# Patient Record
Sex: Female | Born: 1940 | Race: Black or African American | Hispanic: No | Marital: Single | Smoking: Never smoker
Health system: Southern US, Community
[De-identification: ages and names within clinical notes are randomized; demographics above are authoritative.]

## PROBLEM LIST (undated history)

## (undated) DIAGNOSIS — I10 Essential (primary) hypertension: Secondary | ICD-10-CM

## (undated) DIAGNOSIS — H409 Unspecified glaucoma: Secondary | ICD-10-CM

---

## 1998-04-05 ENCOUNTER — Ambulatory Visit (HOSPITAL_COMMUNITY): Admission: RE | Admit: 1998-04-05 | Discharge: 1998-04-05 | Payer: Self-pay

## 1999-04-08 ENCOUNTER — Ambulatory Visit (HOSPITAL_COMMUNITY): Admission: RE | Admit: 1999-04-08 | Discharge: 1999-04-08 | Payer: Self-pay | Admitting: Internal Medicine

## 1999-04-08 ENCOUNTER — Encounter: Payer: Self-pay | Admitting: Internal Medicine

## 2000-04-09 ENCOUNTER — Encounter: Payer: Self-pay | Admitting: Internal Medicine

## 2000-04-09 ENCOUNTER — Ambulatory Visit (HOSPITAL_COMMUNITY): Admission: RE | Admit: 2000-04-09 | Discharge: 2000-04-09 | Payer: Self-pay | Admitting: Internal Medicine

## 2000-04-17 ENCOUNTER — Encounter: Payer: Self-pay | Admitting: Internal Medicine

## 2000-04-17 ENCOUNTER — Encounter: Admission: RE | Admit: 2000-04-17 | Discharge: 2000-04-17 | Payer: Self-pay | Admitting: Internal Medicine

## 2001-02-20 HISTORY — PX: BREAST EXCISIONAL BIOPSY: SUR124

## 2001-02-20 HISTORY — PX: BREAST BIOPSY: SHX20

## 2001-04-10 ENCOUNTER — Ambulatory Visit (HOSPITAL_COMMUNITY): Admission: RE | Admit: 2001-04-10 | Discharge: 2001-04-10 | Payer: Self-pay | Admitting: Internal Medicine

## 2001-04-10 ENCOUNTER — Encounter: Payer: Self-pay | Admitting: Internal Medicine

## 2001-04-25 ENCOUNTER — Encounter: Admission: RE | Admit: 2001-04-25 | Discharge: 2001-04-25 | Payer: Self-pay | Admitting: Internal Medicine

## 2001-04-25 ENCOUNTER — Encounter: Payer: Self-pay | Admitting: Internal Medicine

## 2001-05-06 ENCOUNTER — Encounter: Payer: Self-pay | Admitting: Internal Medicine

## 2001-05-06 ENCOUNTER — Encounter: Admission: RE | Admit: 2001-05-06 | Discharge: 2001-05-06 | Payer: Self-pay | Admitting: Internal Medicine

## 2002-05-13 ENCOUNTER — Encounter: Payer: Self-pay | Admitting: Internal Medicine

## 2002-05-13 ENCOUNTER — Encounter: Admission: RE | Admit: 2002-05-13 | Discharge: 2002-05-13 | Payer: Self-pay | Admitting: Internal Medicine

## 2003-05-14 ENCOUNTER — Encounter: Admission: RE | Admit: 2003-05-14 | Discharge: 2003-05-14 | Payer: Self-pay | Admitting: Internal Medicine

## 2004-05-17 ENCOUNTER — Encounter: Admission: RE | Admit: 2004-05-17 | Discharge: 2004-05-17 | Payer: Self-pay | Admitting: *Deleted

## 2005-05-29 ENCOUNTER — Encounter: Admission: RE | Admit: 2005-05-29 | Discharge: 2005-05-29 | Payer: Self-pay | Admitting: *Deleted

## 2005-12-18 ENCOUNTER — Other Ambulatory Visit: Admission: RE | Admit: 2005-12-18 | Discharge: 2005-12-18 | Payer: Self-pay | Admitting: Obstetrics and Gynecology

## 2006-05-01 ENCOUNTER — Emergency Department (HOSPITAL_COMMUNITY): Admission: EM | Admit: 2006-05-01 | Discharge: 2006-05-01 | Payer: Self-pay | Admitting: Emergency Medicine

## 2006-05-02 ENCOUNTER — Ambulatory Visit (HOSPITAL_COMMUNITY): Admission: RE | Admit: 2006-05-02 | Discharge: 2006-05-02 | Payer: Self-pay | Admitting: Family Medicine

## 2006-06-06 ENCOUNTER — Encounter: Admission: RE | Admit: 2006-06-06 | Discharge: 2006-06-06 | Payer: Self-pay | Admitting: Obstetrics and Gynecology

## 2006-06-22 ENCOUNTER — Encounter: Admission: RE | Admit: 2006-06-22 | Discharge: 2006-06-22 | Payer: Self-pay | Admitting: Obstetrics and Gynecology

## 2007-06-10 ENCOUNTER — Encounter: Admission: RE | Admit: 2007-06-10 | Discharge: 2007-06-10 | Payer: Self-pay | Admitting: Obstetrics and Gynecology

## 2008-03-14 ENCOUNTER — Emergency Department (HOSPITAL_COMMUNITY): Admission: EM | Admit: 2008-03-14 | Discharge: 2008-03-14 | Payer: Self-pay | Admitting: Emergency Medicine

## 2008-06-15 ENCOUNTER — Encounter: Admission: RE | Admit: 2008-06-15 | Discharge: 2008-06-15 | Payer: Self-pay | Admitting: Obstetrics and Gynecology

## 2009-05-27 ENCOUNTER — Ambulatory Visit: Payer: Self-pay | Admitting: Vascular Surgery

## 2009-05-27 ENCOUNTER — Emergency Department (HOSPITAL_COMMUNITY): Admission: EM | Admit: 2009-05-27 | Discharge: 2009-05-27 | Payer: Self-pay | Admitting: Family Medicine

## 2009-06-04 ENCOUNTER — Ambulatory Visit (HOSPITAL_COMMUNITY): Admission: RE | Admit: 2009-06-04 | Discharge: 2009-06-04 | Payer: Self-pay | Admitting: Obstetrics and Gynecology

## 2009-06-16 ENCOUNTER — Encounter: Admission: RE | Admit: 2009-06-16 | Discharge: 2009-06-16 | Payer: Self-pay | Admitting: Obstetrics and Gynecology

## 2010-03-13 ENCOUNTER — Encounter: Payer: Self-pay | Admitting: Obstetrics and Gynecology

## 2010-05-09 ENCOUNTER — Other Ambulatory Visit: Payer: Self-pay | Admitting: Obstetrics and Gynecology

## 2010-05-09 DIAGNOSIS — Z1231 Encounter for screening mammogram for malignant neoplasm of breast: Secondary | ICD-10-CM

## 2010-05-11 LAB — CBC
HCT: 40.7 % (ref 36.0–46.0)
Hemoglobin: 13.2 g/dL (ref 12.0–15.0)
MCV: 83.9 fL (ref 78.0–100.0)

## 2010-05-11 LAB — BASIC METABOLIC PANEL
CO2: 26 mEq/L (ref 19–32)
Chloride: 102 mEq/L (ref 96–112)
GFR calc Af Amer: >60 mL/min (ref >60)
Sodium: 138 mEq/L (ref 135–145)

## 2010-06-20 ENCOUNTER — Ambulatory Visit
Admission: RE | Admit: 2010-06-20 | Discharge: 2010-06-20 | Disposition: A | Discharge status: Home / Self Care | Payer: Medicare Other | Source: Ambulatory Visit | Attending: Obstetrics and Gynecology | Admitting: Obstetrics and Gynecology

## 2010-06-20 DIAGNOSIS — Z1231 Encounter for screening mammogram for malignant neoplasm of breast: Secondary | ICD-10-CM

## 2010-09-23 DIAGNOSIS — I10 Essential (primary) hypertension: Secondary | ICD-10-CM | POA: Insufficient documentation

## 2010-09-23 DIAGNOSIS — J309 Allergic rhinitis, unspecified: Secondary | ICD-10-CM | POA: Insufficient documentation

## 2010-09-23 DIAGNOSIS — M7730 Calcaneal spur, unspecified foot: Secondary | ICD-10-CM | POA: Insufficient documentation

## 2010-09-23 DIAGNOSIS — H409 Unspecified glaucoma: Secondary | ICD-10-CM | POA: Insufficient documentation

## 2010-09-23 DIAGNOSIS — IMO0002 Reserved for concepts with insufficient information to code with codable children: Secondary | ICD-10-CM | POA: Insufficient documentation

## 2010-09-23 DIAGNOSIS — Z803 Family history of malignant neoplasm of breast: Secondary | ICD-10-CM | POA: Insufficient documentation

## 2010-09-23 DIAGNOSIS — N951 Menopausal and female climacteric states: Secondary | ICD-10-CM | POA: Insufficient documentation

## 2011-04-21 DIAGNOSIS — D229 Melanocytic nevi, unspecified: Secondary | ICD-10-CM | POA: Insufficient documentation

## 2011-05-15 ENCOUNTER — Other Ambulatory Visit: Payer: Self-pay | Admitting: Obstetrics and Gynecology

## 2011-05-15 DIAGNOSIS — Z1231 Encounter for screening mammogram for malignant neoplasm of breast: Secondary | ICD-10-CM

## 2011-06-22 ENCOUNTER — Ambulatory Visit
Admission: RE | Admit: 2011-06-22 | Discharge: 2011-06-22 | Disposition: A | Discharge status: Home / Self Care | Payer: BC Managed Care – PPO | Source: Ambulatory Visit | Attending: Obstetrics and Gynecology | Admitting: Obstetrics and Gynecology

## 2011-06-22 DIAGNOSIS — Z1231 Encounter for screening mammogram for malignant neoplasm of breast: Secondary | ICD-10-CM

## 2011-11-15 DIAGNOSIS — H40019 Open angle with borderline findings, low risk, unspecified eye: Secondary | ICD-10-CM | POA: Insufficient documentation

## 2012-05-21 ENCOUNTER — Other Ambulatory Visit: Payer: Self-pay

## 2012-05-21 DIAGNOSIS — Z1231 Encounter for screening mammogram for malignant neoplasm of breast: Secondary | ICD-10-CM

## 2012-06-24 ENCOUNTER — Ambulatory Visit
Admission: RE | Admit: 2012-06-24 | Discharge: 2012-06-24 | Disposition: A | Discharge status: Home / Self Care | Payer: Medicare Other | Source: Ambulatory Visit

## 2012-06-24 DIAGNOSIS — Z1231 Encounter for screening mammogram for malignant neoplasm of breast: Secondary | ICD-10-CM

## 2013-05-29 ENCOUNTER — Other Ambulatory Visit: Payer: Self-pay

## 2013-05-29 DIAGNOSIS — Z1231 Encounter for screening mammogram for malignant neoplasm of breast: Secondary | ICD-10-CM

## 2013-06-25 ENCOUNTER — Encounter (INDEPENDENT_AMBULATORY_CARE_PROVIDER_SITE_OTHER): Payer: Self-pay

## 2013-06-25 ENCOUNTER — Ambulatory Visit
Admission: RE | Admit: 2013-06-25 | Discharge: 2013-06-25 | Disposition: A | Discharge status: Home / Self Care | Payer: Medicare Other | Source: Ambulatory Visit

## 2013-06-25 DIAGNOSIS — Z1231 Encounter for screening mammogram for malignant neoplasm of breast: Secondary | ICD-10-CM

## 2014-05-25 ENCOUNTER — Other Ambulatory Visit: Payer: Self-pay

## 2014-05-25 DIAGNOSIS — Z1231 Encounter for screening mammogram for malignant neoplasm of breast: Secondary | ICD-10-CM

## 2014-06-29 ENCOUNTER — Ambulatory Visit
Admission: RE | Admit: 2014-06-29 | Discharge: 2014-06-29 | Disposition: A | Discharge status: Home / Self Care | Payer: Medicare Other | Source: Ambulatory Visit

## 2014-06-29 DIAGNOSIS — Z1231 Encounter for screening mammogram for malignant neoplasm of breast: Secondary | ICD-10-CM

## 2014-07-01 ENCOUNTER — Other Ambulatory Visit: Payer: Self-pay | Admitting: Internal Medicine

## 2014-07-01 DIAGNOSIS — R928 Other abnormal and inconclusive findings on diagnostic imaging of breast: Secondary | ICD-10-CM

## 2014-07-06 ENCOUNTER — Ambulatory Visit
Admission: RE | Admit: 2014-07-06 | Discharge: 2014-07-06 | Disposition: A | Discharge status: Home / Self Care | Payer: Medicare Other | Source: Ambulatory Visit | Attending: Internal Medicine | Admitting: Internal Medicine

## 2014-07-06 DIAGNOSIS — R928 Other abnormal and inconclusive findings on diagnostic imaging of breast: Secondary | ICD-10-CM

## 2015-05-31 ENCOUNTER — Other Ambulatory Visit: Payer: Self-pay

## 2015-05-31 DIAGNOSIS — Z1231 Encounter for screening mammogram for malignant neoplasm of breast: Secondary | ICD-10-CM

## 2015-06-30 ENCOUNTER — Ambulatory Visit
Admission: RE | Admit: 2015-06-30 | Discharge: 2015-06-30 | Disposition: A | Discharge status: Home / Self Care | Payer: Medicare Other | Source: Ambulatory Visit

## 2015-06-30 DIAGNOSIS — Z1231 Encounter for screening mammogram for malignant neoplasm of breast: Secondary | ICD-10-CM

## 2016-02-10 ENCOUNTER — Other Ambulatory Visit: Payer: Self-pay | Admitting: Internal Medicine

## 2016-02-10 ENCOUNTER — Ambulatory Visit
Admission: RE | Admit: 2016-02-10 | Discharge: 2016-02-10 | Disposition: A | Discharge status: Home / Self Care | Payer: Medicare Other | Source: Ambulatory Visit | Attending: Internal Medicine | Admitting: Internal Medicine

## 2016-02-10 DIAGNOSIS — M542 Cervicalgia: Secondary | ICD-10-CM

## 2016-05-19 ENCOUNTER — Other Ambulatory Visit: Payer: Self-pay | Admitting: Internal Medicine

## 2016-05-19 DIAGNOSIS — Z1231 Encounter for screening mammogram for malignant neoplasm of breast: Secondary | ICD-10-CM

## 2016-06-30 ENCOUNTER — Ambulatory Visit
Admission: RE | Admit: 2016-06-30 | Discharge: 2016-06-30 | Disposition: A | Discharge status: Home / Self Care | Payer: Medicare Other | Source: Ambulatory Visit | Attending: Internal Medicine | Admitting: Internal Medicine

## 2016-06-30 DIAGNOSIS — Z1231 Encounter for screening mammogram for malignant neoplasm of breast: Secondary | ICD-10-CM

## 2016-12-16 ENCOUNTER — Encounter (HOSPITAL_COMMUNITY): Payer: Self-pay | Admitting: Emergency Medicine

## 2016-12-16 ENCOUNTER — Emergency Department (HOSPITAL_COMMUNITY): Payer: No Typology Code available for payment source

## 2016-12-16 ENCOUNTER — Emergency Department (HOSPITAL_COMMUNITY)
Admission: EM | Admit: 2016-12-16 | Discharge: 2016-12-16 | Disposition: A | Discharge status: Home / Self Care | Payer: No Typology Code available for payment source | Attending: Emergency Medicine | Admitting: Emergency Medicine

## 2016-12-16 DIAGNOSIS — M791 Myalgia, unspecified site: Secondary | ICD-10-CM

## 2016-12-16 DIAGNOSIS — M79642 Pain in left hand: Secondary | ICD-10-CM

## 2016-12-16 DIAGNOSIS — Y9389 Activity, other specified: Secondary | ICD-10-CM | POA: Diagnosis not present

## 2016-12-16 DIAGNOSIS — Y92410 Unspecified street and highway as the place of occurrence of the external cause: Secondary | ICD-10-CM | POA: Insufficient documentation

## 2016-12-16 DIAGNOSIS — M25561 Pain in right knee: Secondary | ICD-10-CM | POA: Insufficient documentation

## 2016-12-16 DIAGNOSIS — I10 Essential (primary) hypertension: Secondary | ICD-10-CM | POA: Diagnosis not present

## 2016-12-16 DIAGNOSIS — M25511 Pain in right shoulder: Secondary | ICD-10-CM | POA: Diagnosis not present

## 2016-12-16 DIAGNOSIS — Y999 Unspecified external cause status: Secondary | ICD-10-CM | POA: Insufficient documentation

## 2016-12-16 HISTORY — DX: Essential (primary) hypertension: I10

## 2016-12-16 HISTORY — DX: Unspecified glaucoma: H40.9

## 2016-12-16 MED ORDER — ACETAMINOPHEN 500 MG PO TABS
500.0000 mg | ORAL_TABLET | Freq: Once | ORAL | Status: AC
  Administered 2016-12-16: 500 mg via ORAL
  Filled 2016-12-16: qty 1
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
## 2017-05-21 ENCOUNTER — Other Ambulatory Visit: Payer: Self-pay | Admitting: Internal Medicine

## 2017-05-21 DIAGNOSIS — Z1231 Encounter for screening mammogram for malignant neoplasm of breast: Secondary | ICD-10-CM

## 2017-07-02 ENCOUNTER — Ambulatory Visit
Admission: RE | Admit: 2017-07-02 | Discharge: 2017-07-02 | Disposition: A | Discharge status: Home / Self Care | Payer: Medicare Other | Source: Ambulatory Visit | Attending: Internal Medicine | Admitting: Internal Medicine

## 2017-07-02 DIAGNOSIS — Z1231 Encounter for screening mammogram for malignant neoplasm of breast: Secondary | ICD-10-CM

## 2018-03-01 ENCOUNTER — Encounter: Payer: Self-pay | Admitting: Podiatry

## 2018-03-01 ENCOUNTER — Ambulatory Visit: Payer: Medicare Other | Admitting: Podiatry

## 2018-03-01 VITALS — BP 153/89

## 2018-03-01 DIAGNOSIS — M79675 Pain in left toe(s): Secondary | ICD-10-CM

## 2018-03-01 DIAGNOSIS — B351 Tinea unguium: Secondary | ICD-10-CM | POA: Diagnosis not present

## 2018-03-01 DIAGNOSIS — M79674 Pain in right toe(s): Secondary | ICD-10-CM | POA: Diagnosis not present

## 2018-03-01 NOTE — Progress Notes (Signed)
Subjective: Glenda Roth presents today referred by Seward Carol, MD with cc of painful, discolored, thick toenails which interfere with daily activities.  Pain is aggravated when wearing enclosed shoe gear. Pain is relieved with periodic professional debridement.  Past Medical History:  Diagnosis Date  . Glaucoma   . Hypertension      Patient Active Problem List   Diagnosis Date Noted  . 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 Atypical nevi 04/21/2011  . Allergic rhinitis 09/23/2010  . DDD (degenerative disc disease) 09/23/2010  . Essential hypertension 09/23/2010  . FH: breast cancer 09/23/2010  . Heel spur 09/23/2010  . Menopausal syndrome 09/23/2010  . Glaucoma 09/23/2010     Past Surgical History:  Procedure Laterality Date  . BREAST BIOPSY Right 2003  . BREAST EXCISIONAL BIOPSY Right 2003   benign      Current Outpatient Medications:  .  cephALEXin (KEFLEX) 500 MG capsule, Reported on 07/06/2015, Disp: , Rfl:  .  cycloSPORINE (RESTASIS) 0.05 % ophthalmic emulsion, Apply to eye., Disp: , Rfl:  .  hydrochlorothiazide (HYDRODIURIL) 25 MG tablet, Take by mouth., Disp: , Rfl:  .  losartan (COZAAR) 50 MG tablet, Take 25 mg by mouth., Disp: , Rfl:  .  meloxicam (MOBIC) 15 MG tablet, Take by mouth., Disp: , Rfl:  .  rosuvastatin (CRESTOR) 5 MG tablet, Take 5 mg by mouth daily., Disp: , Rfl:    Allergies  Allergen Reactions  . Ace Inhibitors Cough  . Other Palpitations    Uncoded Allergy. Allergen: ANTIHISTAMINES AND DECONGESTANTS     Social History   Occupational History  . Not on file  Tobacco Use  . Smoking status: Never Smoker  . Smokeless tobacco: Never Used  Substance and Sexual Activity  . Alcohol use: Not on file  . Drug use: Not on file  . Sexual activity: Not on file     Family History  Problem Relation Age of Onset  . Breast cancer Sister       There is no immunization history on file for this patient.   Review of  systems: Positive Findings in bold print.  Constitutional:  chills, fatigue, fever, sweats, weight change Communication: Optometrist, sign Ecologist, hand writing, iPad/Android device Head: headaches, head injury Eyes: changes in vision, eye pain, glaucoma, cataracts, macular degeneration, diplopia, glare,  light sensitivity, eyeglasses or contacts, blindness Ears nose mouth throat: Hard of hearing, ringing in ears, deaf, sign language,  vertigo,   nosebleeds,  rhinitis,  cold sores, snoring, swollen glands Cardiovascular: HTN, edema, arrhythmia, pacemaker in place, defibrillator in place,  chest pain/tightness, chronic anticoagulation, blood clot, heart failure Peripheral Vascular: leg cramps, varicose veins, blood clots, lymphedema Respiratory:  difficulty breathing, denies congestion, SOB, wheezing, cough, emphysema Gastrointestinal: change in appetite or weight, abdominal pain, constipation, diarrhea, nausea, vomiting, vomiting blood, change in bowel habits, abdominal pain, jaundice, rectal bleeding, hemorrhoids, Genitourinary:  nocturia,  pain on urination,  blood in urine, Foley catheter, urinary urgency Musculoskeletal: uses mobility aid,  cramping, stiff joints, painful joints, decreased joint motion, fractures, OA, gout Skin: +changes in toenails, color change, dryness, itching, mole changes,  rash  Neurological: headaches, numbness in feet, paresthesias in feet, burning in feet, fainting,  seizures, change in speech. denies headaches, memory problems/poor historian, cerebral palsy, weakness, paralysis Endocrine: diabetes, hypothyroidism, hyperthyroidism,  goiter, dry mouth, flushing, heat intolerance,  cold intolerance,  excessive thirst, denies polyuria,  nocturia Hematological:  easy bleeding, excessive bleeding, easy bruising, enlarged lymph nodes,  on long term blood thinner, history of past transusions Allergy/immunological:  hives, eczema, frequent infections, multiple drug  allergies, seasonal allergies, transplant recipient Psychiatric:  anxiety, depression, mood disorder, suicidal ideations, hallucinations   Objective: Vascular Examination: Capillary refill time immediate x 10 digits Dorsalis pedis and posterior tibial pulses present b/l No digital hair x 10 digits Skin temperature gradient WNL b/l  Dermatological Examination: Skin with normal turgor, texture and tone b/l  Toenails 1-5 b/l discolored, thick, dystrophic with subungual debris and pain with palpation to nailbeds due to thickness of nails.  Musculoskeletal: Muscle strength 5/5 to all LE muscle groups  HAV with bunion b/l  Hammertoes 2-5 b/l  Neurological: Sensation intact with 10 gram monofilament Vibratory sensation intact.  Assessment: 1. Painful onychomycosis toenails 1-5 b/l    Plan: 1. Discussed onychomycosis and treatment options.  Literature dispensed on today. 2. Will purchase Toecyclen Gel for daily application  3. Toenails 1-5 b/l were debrided in length and girth without iatrogenic bleeding. 4. Patient to continue soft, supportive shoe gear 5. Patient to report any pedal injuries to medical professional immediately. 6. Follow up 3 months. Patient/POA to call should there be a concern in the interim.

## 2018-03-01 NOTE — Patient Instructions (Signed)
Onychomycosis/Fungal Toenails  WHAT IS IT? An infection that lies within the keratin of your nail plate that is caused by a fungus.  WHY ME? Fungal infections affect all ages, sexes, races, and creeds.  There may be many factors that predispose you to a fungal infection such as age, coexisting medical conditions such as diabetes, or an autoimmune disease; stress, medications, fatigue, genetics, etc.  Bottom line: fungus thrives in a warm, moist environment and your shoes offer such a location.  IS IT CONTAGIOUS? Theoretically, yes.  You do not want to share shoes, nail clippers or files with someone who has fungal toenails.  Walking around barefoot in the same room or sleeping in the same bed is unlikely to transfer the organism.  It is important to realize, however, that fungus can spread easily from one nail to the next on the same foot.  HOW DO WE TREAT THIS?  There are several ways to treat this condition.  Treatment may depend on many factors such as age, medications, pregnancy, liver and kidney conditions, etc.  It is best to ask your doctor which options are available to you.  1. No treatment.   Unlike many other medical concerns, you can live with this condition.  However for many people this can be a painful condition and may lead to ingrown toenails or a bacterial infection.  It is recommended that you keep the nails cut short to help reduce the amount of fungal nail. 2. Topical treatment.  These range from herbal remedies to prescription strength nail lacquers.  About 40-50% effective, topicals require twice daily application for approximately 9 to 12 months or until an entirely new nail has grown out.  The most effective topicals are medical grade medications available through physicians offices. 3. Oral antifungal medications.  With an 80-90% cure rate, the most common oral medication requires 3 to 4 months of therapy and stays in your system for a year as the new nail grows out.  Oral  antifungal medications do require blood work to make sure it is a safe drug for you.  A liver function panel will be performed prior to starting the medication and after the first month of treatment.  It is important to have the blood work performed to avoid any harmful side effects.  In general, this medication safe but blood work is required. 4. Laser Therapy.  This treatment is performed by applying a specialized laser to the affected nail plate.  This therapy is noninvasive, fast, and non-painful.  It is not covered by insurance and is therefore, out of pocket.  The results have been very good with a 80-95% cure rate.  [facility] is the only practice in the area to offer this therapy. 5. Permanent Nail Avulsion.  Removing the entire nail so that a new nail will not grow back.  Corns and Calluses Corns are small areas of thickened skin that occur on the top, sides, or tip of a toe. They contain a cone-shaped core with a point that can press on a nerve below. This causes pain.  Calluses are areas of thickened skin that can occur anywhere on the body, including the hands, fingers, palms, soles of the feet, and heels. Calluses are usually larger than corns. What are the causes? Corns and calluses are caused by rubbing (friction) or pressure, such as from shoes that are too tight or do not fit properly. What increases the risk? Corns are more likely to develop in people   who have misshapen toes (toe deformities), such as hammer toes. Calluses can occur with friction to any area of the skin. They are more likely to develop in people who:  Work with their hands.  Wear shoes that fit poorly, are too tight, or are high-heeled.  Have toe deformities. What are the signs or symptoms? Symptoms of a corn or callus include:  A hard growth on the skin.  Pain or tenderness under the skin.  Redness and swelling.  Increased discomfort while wearing tight-fitting shoes, if your feet are  affected. If a corn or callus becomes infected, symptoms may include:  Redness and swelling that gets worse.  Pain.  Fluid, blood, or pus draining from the corn or callus. How is this diagnosed? Corns and calluses may be diagnosed based on your symptoms, your medical history, and a physical exam. How is this treated? Treatment for corns and calluses may include:  Removing the cause of the friction or pressure. This may involve: ? Changing your shoes. ? Wearing shoe inserts (orthotics) or other protective layers in your shoes, such as a corn pad. ? Wearing gloves.  Applying medicine to the skin (topical medicine) to help soften skin in the hardened, thickened areas.  Removing layers of dead skin with a file to reduce the size of the corn or callus.  Removing the corn or callus with a scalpel or laser.  Taking antibiotic medicines, if your corn or callus is infected.  Having surgery, if a toe deformity is the cause. Follow these instructions at home:   Take over-the-counter and prescription medicines only as told by your health care provider.  If you were prescribed an antibiotic, take it as told by your health care provider. Do not stop taking it even if your condition starts to improve.  Wear shoes that fit well. Avoid wearing high-heeled shoes and shoes that are too tight or too loose.  Wear any padding, protective layers, gloves, or orthotics as told by your health care provider.  Soak your hands or feet and then use a file or pumice stone to soften your corn or callus. Do this as told by your health care provider.  Check your corn or callus every day for symptoms of infection. Contact a health care provider if you:  Notice that your symptoms do not improve with treatment.  Have redness or swelling that gets worse.  Notice that your corn or callus becomes painful.  Have fluid, blood, or pus coming from your corn or callus.  Have new symptoms. Summary  Corns are  small areas of thickened skin that occur on the top, sides, or tip of a toe.  Calluses are areas of thickened skin that can occur anywhere on the body, including the hands, fingers, palms, and soles of the feet. Calluses are usually larger than corns.  Corns and calluses are caused by rubbing (friction) or pressure, such as from shoes that are too tight or do not fit properly.  Treatment may include wearing any padding, protective layers, gloves, or orthotics as told by your health care provider. This information is not intended to replace advice given to you by your health care provider. Make sure you discuss any questions you have with your health care provider. Document Released: 11/13/2003 Document Revised: 12/20/2016 Document Reviewed: 12/20/2016 Elsevier Interactive Patient Education  2019 [employer]  

## 2018-05-24 ENCOUNTER — Other Ambulatory Visit: Payer: Self-pay | Admitting: Internal Medicine

## 2018-05-24 DIAGNOSIS — Z1231 Encounter for screening mammogram for malignant neoplasm of breast: Secondary | ICD-10-CM

## 2018-05-31 ENCOUNTER — Ambulatory Visit: Payer: Medicare Other | Admitting: Podiatry

## 2018-06-03 ENCOUNTER — Ambulatory Visit: Payer: Medicare Other | Admitting: Podiatry

## 2018-07-03 ENCOUNTER — Ambulatory Visit: Payer: Medicare Other | Admitting: Podiatry

## 2018-07-03 ENCOUNTER — Other Ambulatory Visit: Payer: Self-pay

## 2018-07-03 ENCOUNTER — Encounter: Payer: Self-pay | Admitting: Podiatry

## 2018-07-03 VITALS — Temp 97.3°F

## 2018-07-03 DIAGNOSIS — B351 Tinea unguium: Secondary | ICD-10-CM | POA: Diagnosis not present

## 2018-07-03 DIAGNOSIS — M79675 Pain in left toe(s): Secondary | ICD-10-CM | POA: Diagnosis not present

## 2018-07-03 DIAGNOSIS — M79674 Pain in right toe(s): Secondary | ICD-10-CM

## 2018-07-11 NOTE — Progress Notes (Signed)
Subjective:  Glenda Roth presents to clinic today with cc of  painful, thick, discolored, elongated toenails 1-5 b/l that become tender and she cannot cut because of thickness.  Pain is aggravated when wearing enclosed shoe gear.  Seward Carol, MD is her PcP.    Current Outpatient Medications:  .  cephALEXin (KEFLEX) 500 MG capsule, Reported on 07/06/2015, Disp: , Rfl:  .  cycloSPORINE (RESTASIS) 0.05 % ophthalmic emulsion, Apply to eye., Disp: , Rfl:  .  hydrochlorothiazide (HYDRODIURIL) 25 MG tablet, Take by mouth., Disp: , Rfl:  .  latanoprost (XALATAN) 0.005 % ophthalmic solution, , Disp: , Rfl:  .  losartan (COZAAR) 50 MG tablet, Take 25 mg by mouth., Disp: , Rfl:  .  meloxicam (MOBIC) 15 MG tablet, Take by mouth., Disp: , Rfl:  .  rosuvastatin (CRESTOR) 5 MG tablet, Take 5 mg by mouth daily., Disp: , Rfl:    Allergies  Allergen Reactions  . Ace Inhibitors Cough  . Other Palpitations    Uncoded Allergy. Allergen: ANTIHISTAMINES AND DECONGESTANTS     Objective: Vitals:   07/03/18 1405  Temp: (!) 97.3 F (36.3 C)    Physical Examination:  Vascular  Examination: Capillary refill time immediate x 10 digits.  Palpable DP/PT pulses b/l.  Digital hair absent t b/l.  No edema noted b/l.  Skin temperature gradient WNL b/l.  Dermatological Examination: Skin with normal turgor, texture and tone b/l.  No open wounds b/l.  No interdigital macerations noted b/l.  Elongated, thick, discolored brittle toenails with subungual debris and pain on dorsal palpation of nailbeds 1-5 b/l.  Musculoskeletal Examination: Muscle strength 5/5 to all muscle groups b/l.  HAV with bunion deformity bilaterally.  Hammertoes 2 through 5 bilaterally.  No pain, crepitus or joint discomfort with active/passive ROM.  Neurological Examination: Sensation intact 5/5 b/l with 10 gram monofilament.  Vibratory sensation intact b/l.  Proprioceptive sensation intact  b/l.  Assessment: Mycotic nail infection with pain 1-5 b/l  Plan: 1. Toenails 1-5 b/l were debrided in length and girth without iatrogenic laceration. 2.  Continue soft, supportive shoe gear daily. 3.  Report any pedal injuries to medical professional. 4.  Follow up 3 months. 5.  Patient/POA to call should there be a question/concern in there interim.

## 2018-07-18 ENCOUNTER — Other Ambulatory Visit: Payer: Self-pay

## 2018-07-18 ENCOUNTER — Ambulatory Visit
Admission: RE | Admit: 2018-07-18 | Discharge: 2018-07-18 | Disposition: A | Discharge status: Home / Self Care | Payer: Medicare Other | Source: Ambulatory Visit | Attending: Internal Medicine | Admitting: Internal Medicine

## 2018-07-18 DIAGNOSIS — Z1231 Encounter for screening mammogram for malignant neoplasm of breast: Secondary | ICD-10-CM

## 2018-10-04 ENCOUNTER — Ambulatory Visit: Payer: Medicare Other | Admitting: Podiatry

## 2018-10-21 ENCOUNTER — Ambulatory Visit (INDEPENDENT_AMBULATORY_CARE_PROVIDER_SITE_OTHER): Payer: Medicare Other | Admitting: Podiatry

## 2018-10-21 ENCOUNTER — Encounter: Payer: Self-pay | Admitting: Podiatry

## 2018-10-21 ENCOUNTER — Encounter

## 2018-10-21 ENCOUNTER — Other Ambulatory Visit: Payer: Self-pay

## 2018-10-21 DIAGNOSIS — B351 Tinea unguium: Secondary | ICD-10-CM

## 2018-10-21 DIAGNOSIS — M79675 Pain in left toe(s): Secondary | ICD-10-CM

## 2018-10-21 DIAGNOSIS — M79674 Pain in right toe(s): Secondary | ICD-10-CM | POA: Diagnosis not present

## 2018-10-21 NOTE — Progress Notes (Signed)
Subjective: Glenda Roth presents today with painful, thick toenails 1-5 b/l that she cannot cut and which interfere with daily activities.  Pain is aggravated when wearing enclosed shoe gear.  Seward Carol, MD is her PCP.   She relates tenderness to right 2nd digit.   Current Outpatient Medications:  .  Aspirin Buf,CaCarb-MgCarb-MgO, 81 MG TABS, Take by mouth., Disp: , Rfl:  .  cephALEXin (KEFLEX) 500 MG capsule, Reported on 07/06/2015, Disp: , Rfl:  .  cycloSPORINE (RESTASIS) 0.05 % ophthalmic emulsion, Apply to eye., Disp: , Rfl:  .  hydrochlorothiazide (HYDRODIURIL) 25 MG tablet, Take by mouth., Disp: , Rfl:  .  latanoprost (XALATAN) 0.005 % ophthalmic solution, , Disp: , Rfl:  .  losartan (COZAAR) 25 MG tablet, , Disp: , Rfl:  .  losartan (COZAAR) 50 MG tablet, Take 25 mg by mouth., Disp: , Rfl:  .  meloxicam (MOBIC) 15 MG tablet, Take by mouth., Disp: , Rfl:  .  rosuvastatin (CRESTOR) 5 MG tablet, Take 5 mg by mouth daily., Disp: , Rfl:   Allergies  Allergen Reactions  . Ace Inhibitors Cough  . Other Palpitations    Uncoded Allergy. Allergen: ANTIHISTAMINES AND DECONGESTANTS    Objective:  Vascular Examination: Capillary refill time immediate x 10 digits  Dorsalis pedis and Posterior tibial pulses palpable b/l.  Digital hair absent b/l.  Skin temperature gradient WNL b/l.  Dermatological Examination: Skin with normal turgor, texture and tone b/l.  Toenails 1-5 b/l discolored, thick, dystrophic with subungual debris and pain with palpation to nailbeds due to thickness of nails.  Musculoskeletal: Muscle strength 5/5 b/l to all LE muscle groups.  HAV with bunion b/l. Hammertoes 2-5 b/l.  No pain, crepitus or joint limitation noted with ROM.   Neurological: Sensation intact 5/5 b/l with 10 gram monofilament.  Vibratory sensation intact b/l.  Assessment: Painful onychomycosis toenails 1-5 b/l   Plan: 1. Toenails 1-5 b/l were debrided in length and girth  without iatrogenic bleeding. 2. Patient to continue soft, supportive shoe gear daily. 3. Patient to report any pedal injuries to medical professional immediately. 4. Follow up 3 months.  5. Patient/POA to call should there be a concern in the interim.

## 2018-10-21 NOTE — Patient Instructions (Signed)

## 2019-01-10 ENCOUNTER — Encounter: Payer: Self-pay | Admitting: Podiatry

## 2019-01-10 ENCOUNTER — Ambulatory Visit: Payer: Medicare Other | Admitting: Podiatry

## 2019-01-10 ENCOUNTER — Other Ambulatory Visit: Payer: Self-pay

## 2019-01-10 DIAGNOSIS — M79675 Pain in left toe(s): Secondary | ICD-10-CM

## 2019-01-10 DIAGNOSIS — B351 Tinea unguium: Secondary | ICD-10-CM | POA: Diagnosis not present

## 2019-01-10 DIAGNOSIS — M79674 Pain in right toe(s): Secondary | ICD-10-CM | POA: Diagnosis not present

## 2019-01-18 NOTE — Progress Notes (Signed)
Subjective: Glenda Roth is seen today for follow up painful, elongated, thickened toenails bilateral feet that she cannot cut. Pain interferes with daily activities. Aggravating factor includes wearing enclosed shoe gear and relieved with periodic debridement.  Medications reviewed in chart.  Allergies  Allergen Reactions  . Ace Inhibitors Cough  . Other Palpitations    Uncoded Allergy. Allergen: ANTIHISTAMINES AND DECONGESTANTS    Objective:  Vascular Examination: Capillary refill time immediate b/l.  Dorsalis pedis present b/l.  Posterior tibial pulses present b/l.  Digital hair absent b/l.  Skin temperature gradient WNL b/l.   Dermatological Examination: Skin with normal turgor, texture and tone b/l.  Toenails 1-5 b/l discolored, thick, dystrophic with subungual debris and pain with palpation to nailbeds due to thickness of nails.  Musculoskeletal: Muscle strength 5/5 to all LE muscle groups b/l.  HAV with bunion b/l. Hammertoes b/l.   No pain, crepitus or joint limitation noted with ROM.   Neurological Examination: Protective sensation intact 5/5 with 10 gram monofilament bilaterally.  Epicritic sensation present bilaterally.  Vibratory sensation intact bilaterally.   Assessment: Painful onychomycosis toenails 1-5 b/l   Plan: 1. Toenails 1-5 b/l were debrided in length and girth without iatrogenic bleeding. 2. Patient to continue soft, supportive shoe gear daily. 3. Patient to report any pedal injuries to medical professional immediately. 4. Follow up 9 weeks. 5. Patient/POA to call should there be a concern in the interim.

## 2019-03-17 ENCOUNTER — Ambulatory Visit: Payer: Medicare PPO | Attending: Internal Medicine

## 2019-03-17 DIAGNOSIS — Z23 Encounter for immunization: Secondary | ICD-10-CM | POA: Insufficient documentation

## 2019-03-17 NOTE — Progress Notes (Signed)
   Covid-19 Vaccination Clinic  Name:  Glenda Roth    MRN: [id number] DOB: 05/12/1940  03/17/2019  [name] was observed post Covid-19 immunization for 15 minutes without incidence. She was provided with Vaccine Information Sheet and instruction to access the V-Safe system.   [name] was instructed to call 911 with any severe reactions post vaccine: Marland Kitchen Difficulty breathing  . Swelling of your face and throat  . A fast heartbeat  . A bad rash all over your body  . Dizziness and weakness    Immunizations Administered    Name Date Dose VIS Date Route   Pfizer COVID-19 Vaccine 03/17/2019 11:01 AM 0.3 mL 01/31/2019 Intramuscular   Manufacturer: [place]   Lot: [id number]   [place]: [id number]

## 2019-03-18 ENCOUNTER — Ambulatory Visit: Payer: Medicare Other | Admitting: Podiatry

## 2019-04-07 ENCOUNTER — Ambulatory Visit: Payer: Medicare PPO | Attending: Internal Medicine

## 2019-04-07 DIAGNOSIS — Z23 Encounter for immunization: Secondary | ICD-10-CM | POA: Insufficient documentation

## 2019-04-07 NOTE — Progress Notes (Signed)
   Covid-19 Vaccination Clinic  Name:  Glenda Roth    MRN: [id number] DOB: 1940/06/12  04/07/2019  Glenda Roth was observed post Covid-19 immunization for 15 minutes without incidence. She was provided with Vaccine Information Sheet and instruction to access the V-Safe system.   Glenda Roth was instructed to call 911 with any severe reactions post vaccine: Marland Kitchen Difficulty breathing  . Swelling of your face and throat  . A fast heartbeat  . A bad rash all over your body  . Dizziness and weakness    Immunizations Administered    Name Date Dose VIS Date Route   Pfizer COVID-19 Vaccine 04/07/2019 10:54 AM 0.3 mL 01/31/2019 Intramuscular   Manufacturer: [place]   Lot: [id number]   [place]: [id number]

## 2019-05-16 ENCOUNTER — Other Ambulatory Visit: Payer: Self-pay

## 2019-05-16 ENCOUNTER — Encounter: Payer: Self-pay | Admitting: Podiatry

## 2019-05-16 ENCOUNTER — Ambulatory Visit: Payer: Medicare Other | Admitting: Podiatry

## 2019-05-16 VITALS — Temp 96.5°F

## 2019-05-16 DIAGNOSIS — B351 Tinea unguium: Secondary | ICD-10-CM

## 2019-05-16 DIAGNOSIS — M79675 Pain in left toe(s): Secondary | ICD-10-CM | POA: Diagnosis not present

## 2019-05-16 DIAGNOSIS — M79674 Pain in right toe(s): Secondary | ICD-10-CM

## 2019-05-16 NOTE — Patient Instructions (Signed)

## 2019-05-19 NOTE — Progress Notes (Signed)
Subjective: Glenda Roth presents today for follow up of painful mycotic nails b/l that are difficult to trim. Pain interferes with ambulation. Aggravating factors include wearing enclosed shoe gear. Pain is relieved with periodic professional debridement.   Allergies  Allergen Reactions  . Ace Inhibitors Cough  . Other Palpitations    Uncoded Allergy. Allergen: ANTIHISTAMINES AND DECONGESTANTS     Objective: Vitals:   05/16/19 0941  Temp: (!) 96.5 F (35.8 C)    Pt 79 y.o. year old female  in NAD. [name] x 3.   Vascular Examination:  Capillary refill time to digits immediate b/l. Palpable DP pulses b/l. Palpable PT pulses b/l. Pedal hair absent b/l Skin temperature gradient within normal limits b/l.  Dermatological Examination: Pedal skin with normal turgor, texture and tone bilaterally. No open wounds bilaterally. No interdigital macerations bilaterally. Toenails 1-5 b/l elongated, dystrophic, thickened, crumbly with subungual debris and tenderness to dorsal palpation.  Musculoskeletal: Normal muscle strength 5/5 to all lower extremity muscle groups bilaterally, no pain crepitus or joint limitation noted with ROM b/l, bunion deformity noted b/l and hammertoes noted to the  2-5 bilaterally  Neurological: Protective sensation intact 5/5 intact bilaterally with 10g monofilament b/l Vibratory sensation intact b/l  Assessment: 1. Pain due to onychomycosis of toenails of both feet    Plan: -Toenails 1-5 b/l were debrided in length and girth with sterile nail nippers and dremel without iatrogenic bleeding.  -Patient to continue soft, supportive shoe gear daily. -Patient to report any pedal injuries to medical professional immediately. -Patient/POA to call should there be question/concern in the interim.  Return in about 4 months (around 09/15/2019).

## 2019-06-16 ENCOUNTER — Other Ambulatory Visit: Payer: Self-pay | Admitting: Internal Medicine

## 2019-06-16 DIAGNOSIS — Z1231 Encounter for screening mammogram for malignant neoplasm of breast: Secondary | ICD-10-CM

## 2019-07-14 DIAGNOSIS — M546 Pain in thoracic spine: Secondary | ICD-10-CM | POA: Diagnosis not present

## 2019-07-22 ENCOUNTER — Other Ambulatory Visit: Payer: Self-pay

## 2019-07-22 ENCOUNTER — Ambulatory Visit
Admission: RE | Admit: 2019-07-22 | Discharge: 2019-07-22 | Disposition: A | Discharge status: Home / Self Care | Payer: Medicare PPO | Source: Ambulatory Visit | Attending: Internal Medicine | Admitting: Internal Medicine

## 2019-07-22 DIAGNOSIS — Z1231 Encounter for screening mammogram for malignant neoplasm of breast: Secondary | ICD-10-CM | POA: Diagnosis not present

## 2019-09-15 ENCOUNTER — Other Ambulatory Visit: Payer: Self-pay

## 2019-09-15 ENCOUNTER — Encounter: Payer: Self-pay | Admitting: Podiatry

## 2019-09-15 ENCOUNTER — Ambulatory Visit: Payer: Medicare PPO | Admitting: Podiatry

## 2019-09-15 DIAGNOSIS — M79674 Pain in right toe(s): Secondary | ICD-10-CM

## 2019-09-15 DIAGNOSIS — M79675 Pain in left toe(s): Secondary | ICD-10-CM

## 2019-09-15 DIAGNOSIS — B351 Tinea unguium: Secondary | ICD-10-CM | POA: Diagnosis not present

## 2019-09-15 NOTE — Progress Notes (Signed)
Subjective:  Patient ID: Glenda Roth, female    DOB: 02-12-1941,  MRN: [id number]  Glenda Roth presents to clinic today for painful thick toenails that are difficult to trim. Pain interferes with ambulation. Aggravating factors include wearing enclosed shoe gear. Pain is relieved with periodic professional debridement.  79 y.o. female presents with the above complaint.   Review of Systems: Negative except as noted in the HPI. Past Medical History:  Diagnosis Date  . Glaucoma   . Hypertension    Past Surgical History:  Procedure Laterality Date  . BREAST BIOPSY Right 2003  . BREAST EXCISIONAL BIOPSY Right 2003   benign    Current Outpatient Medications:  .  Aspirin Buf,CaCarb-MgCarb-MgO, 81 MG TABS, Take by mouth., Disp: , Rfl:  .  cycloSPORINE (RESTASIS) 0.05 % ophthalmic emulsion, Apply to eye., Disp: , Rfl:  .  hydrochlorothiazide (HYDRODIURIL) 25 MG tablet, Take by mouth., Disp: , Rfl:  .  latanoprost (XALATAN) 0.005 % ophthalmic solution, , Disp: , Rfl:  .  losartan (COZAAR) 25 MG tablet, , Disp: , Rfl:  .  losartan (COZAAR) 50 MG tablet, Take 25 mg by mouth., Disp: , Rfl:  .  meloxicam (MOBIC) 15 MG tablet, Take by mouth., Disp: , Rfl:  .  rosuvastatin (CRESTOR) 5 MG tablet, Take 5 mg by mouth daily., Disp: , Rfl:  .  traMADol (ULTRAM) 50 MG tablet, , Disp: , Rfl:  Allergies  Allergen Reactions  . Ace Inhibitors Cough  . Other Palpitations    Uncoded Allergy. Allergen: ANTIHISTAMINES AND DECONGESTANTS   Social History   Occupational History  . Not on file  Tobacco Use  . Smoking status: Never Smoker  . Smokeless tobacco: Never Used  Substance and Sexual Activity  . Alcohol use: Not on file  . Drug use: Not on file  . Sexual activity: Not on file    Objective:   Constitutional Glenda Roth is a pleasant 79 y.o. African American female, in NAD. [name] x 3.   Vascular Dorsalis pedis pulses palpable bilaterally. Posterior tibial pulses palpable  bilaterally. Capillary refill normal to all digits.  No cyanosis or clubbing noted. Pedal hair growth diminished b/l. Lower extremity skin temperature gradient within normal limits.  Neurologic Normal speech. Oriented to person, place, and time. Epicritic sensation to light touch grossly present bilaterally. Protective sensation intact 5/5 intact bilaterally with 10g monofilament b/l. Vibratory sensation intact b/l. Proprioception intact bilaterally.  Dermatologic Pedal skin with normal turgor, texture and tone b/l.  Toenails are discolored, thick, elongated, dystrophic with pain on palpation x 10 No open wounds. No skin lesions.  Orthopedic: Normal muscle strength 5/5 to all lower extremity muscle groups bilaterally. No pain crepitus or joint limitation noted with ROM b/l. 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 Hammertoes noted to the b/l lower extremities. No bony tenderness.    Radiographs: None Assessment:   1. Pain due to onychomycosis of toenails of both feet    Plan:  Patient was evaluated and treated and all questions answered.  Onychomycosis with pain -Nails palliatively debridement as below -Educated on self-care  Procedure: Nail Debridement Rationale: Pain Type of Debridement: manual, sharp debridement. Instrumentation: Nail nipper, rotary burr. Number of Nails: 10 -Examined patient. -No new findings. No new orders. -Toenails 1-5 b/l were debrided in length and girth with sterile nail nippers and dremel without iatrogenic bleeding.  -Patient to report any pedal injuries to medical professional immediately. -Patient to continue soft, supportive shoe gear daily. -  Patient/POA to call should there be question/concern in the interim.  Return in about 4 months (around 01/16/2020) for nail trim.  Marzetta Board, DPM

## 2019-10-06 DIAGNOSIS — H409 Unspecified glaucoma: Secondary | ICD-10-CM | POA: Diagnosis not present

## 2019-10-06 DIAGNOSIS — E7800 Pure hypercholesterolemia, unspecified: Secondary | ICD-10-CM | POA: Diagnosis not present

## 2019-10-06 DIAGNOSIS — E782 Mixed hyperlipidemia: Secondary | ICD-10-CM | POA: Diagnosis not present

## 2019-10-06 DIAGNOSIS — H40021 Open angle with borderline findings, high risk, right eye: Secondary | ICD-10-CM | POA: Diagnosis not present

## 2019-10-06 DIAGNOSIS — I10 Essential (primary) hypertension: Secondary | ICD-10-CM | POA: Diagnosis not present

## 2019-10-06 DIAGNOSIS — H401121 Primary open-angle glaucoma, left eye, mild stage: Secondary | ICD-10-CM | POA: Diagnosis not present

## 2019-10-28 ENCOUNTER — Other Ambulatory Visit: Payer: Self-pay | Admitting: Physician Assistant

## 2019-10-28 ENCOUNTER — Ambulatory Visit
Admission: RE | Admit: 2019-10-28 | Discharge: 2019-10-28 | Disposition: A | Discharge status: Home / Self Care | Payer: Medicare PPO | Source: Ambulatory Visit | Attending: Physician Assistant | Admitting: Physician Assistant

## 2019-10-28 DIAGNOSIS — M79602 Pain in left arm: Secondary | ICD-10-CM | POA: Diagnosis not present

## 2019-10-28 DIAGNOSIS — M19012 Primary osteoarthritis, left shoulder: Secondary | ICD-10-CM | POA: Diagnosis not present

## 2019-11-14 DIAGNOSIS — I10 Essential (primary) hypertension: Secondary | ICD-10-CM | POA: Diagnosis not present

## 2019-11-14 DIAGNOSIS — H409 Unspecified glaucoma: Secondary | ICD-10-CM | POA: Diagnosis not present

## 2019-11-14 DIAGNOSIS — E7800 Pure hypercholesterolemia, unspecified: Secondary | ICD-10-CM | POA: Diagnosis not present

## 2019-11-14 DIAGNOSIS — Z0000 Encounter for general adult medical examination without abnormal findings: Secondary | ICD-10-CM | POA: Diagnosis not present

## 2019-11-14 DIAGNOSIS — E663 Overweight: Secondary | ICD-10-CM | POA: Diagnosis not present

## 2019-12-23 ENCOUNTER — Other Ambulatory Visit: Payer: Self-pay

## 2019-12-23 ENCOUNTER — Ambulatory Visit: Payer: Medicare PPO | Admitting: Podiatry

## 2019-12-23 ENCOUNTER — Encounter: Payer: Self-pay | Admitting: Podiatry

## 2019-12-23 DIAGNOSIS — M79675 Pain in left toe(s): Secondary | ICD-10-CM | POA: Diagnosis not present

## 2019-12-23 DIAGNOSIS — M79674 Pain in right toe(s): Secondary | ICD-10-CM | POA: Diagnosis not present

## 2019-12-23 DIAGNOSIS — B351 Tinea unguium: Secondary | ICD-10-CM | POA: Diagnosis not present

## 2019-12-27 NOTE — Progress Notes (Signed)
Subjective: Glenda Roth is a pleasant 79 y.o. female patient seen today painful thick toenails that are difficult to trim. Pain interferes with ambulation. Aggravating factors include wearing enclosed shoe gear. Pain is relieved with periodic professional debridement.  She voices no new pedal concerns on today's visit.  Past Medical History:  Diagnosis Date  . Glaucoma   . Hypertension     Patient Active Problem List   Diagnosis Date Noted  . 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 Atypical nevi 04/21/2011  . Allergic rhinitis 09/23/2010  . DDD (degenerative disc disease) 09/23/2010  . Essential hypertension 09/23/2010  . FH: breast cancer 09/23/2010  . Heel spur 09/23/2010  . Menopausal syndrome 09/23/2010  . Glaucoma 09/23/2010    Current Outpatient Medications on File Prior to Visit  Medication Sig Dispense Refill  . Aspirin Buf,CaCarb-MgCarb-MgO, 81 MG TABS Take by mouth.    . cycloSPORINE (RESTASIS) 0.05 % ophthalmic emulsion Apply to eye.    . hydrochlorothiazide (HYDRODIURIL) 25 MG tablet Take by mouth.    . latanoprost (XALATAN) 0.005 % ophthalmic solution     . losartan (COZAAR) 25 MG tablet     . losartan (COZAAR) 50 MG tablet Take 25 mg by mouth.    . meloxicam (MOBIC) 15 MG tablet Take by mouth.    . rosuvastatin (CRESTOR) 5 MG tablet Take 5 mg by mouth daily.    . traMADol (ULTRAM) 50 MG tablet      No current facility-administered medications on file prior to visit.    Allergies  Allergen Reactions  . Ace Inhibitors Cough  . Other Palpitations    Uncoded Allergy. Allergen: ANTIHISTAMINES AND DECONGESTANTS    Objective: Physical Exam  General: Glenda Roth is a pleasant 79 y.o. African American female, WD, WN in NAD. [name] x 3.   Vascular:  Capillary refill time to digits immediate b/l. Palpable pedal pulses b/l LE. Pedal hair diminished b/l. Lower extremity skin temperature gradient within normal limits. No pain with calf  compression b/l. No edema noted b/l lower extremities.  Dermatological:  Pedal skin with normal turgor, texture and tone bilaterally. No open wounds bilaterally. No interdigital macerations bilaterally. Toenails 1-5 b/l elongated, discolored, dystrophic, thickened, crumbly with subungual debris and tenderness to dorsal palpation. No hyperkeratotic nor porokeratotic lesions present on today's visit.  Musculoskeletal:  Normal muscle strength 5/5 to all lower extremity muscle groups bilaterally. No pain crepitus or joint limitation noted with ROM b/l. 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 Hammertoes noted to the b/l lower extremities.  Neurological:  Protective sensation intact 5/5 intact bilaterally with 10g monofilament b/l. Vibratory sensation intact b/l. Clonus negative b/l.  Assessment and Plan:  1. Pain due to onychomycosis of toenails of both feet   -Examined patient. -No new findings. No new orders. -Toenails 1-5 b/l were debrided in length and girth with sterile nail nippers and dremel without iatrogenic bleeding.  -Patient to report any pedal injuries to medical professional immediately. -Patient to continue soft, supportive shoe gear daily. -Patient/POA to call should there be question/concern in the interim.  Return in about 4 months (around 04/21/2020).  Marzetta Board, DPM

## 2020-01-12 ENCOUNTER — Ambulatory Visit: Payer: Medicare PPO | Admitting: Podiatry

## 2020-02-09 DIAGNOSIS — H40021 Open angle with borderline findings, high risk, right eye: Secondary | ICD-10-CM | POA: Diagnosis not present

## 2020-02-09 DIAGNOSIS — H401121 Primary open-angle glaucoma, left eye, mild stage: Secondary | ICD-10-CM | POA: Diagnosis not present

## 2020-02-09 DIAGNOSIS — H04123 Dry eye syndrome of bilateral lacrimal glands: Secondary | ICD-10-CM | POA: Diagnosis not present

## 2020-03-13 ENCOUNTER — Other Ambulatory Visit: Payer: Self-pay | Admitting: Internal Medicine

## 2020-03-13 DIAGNOSIS — M8580 Other specified disorders of bone density and structure, unspecified site: Secondary | ICD-10-CM

## 2020-03-18 DIAGNOSIS — H409 Unspecified glaucoma: Secondary | ICD-10-CM | POA: Diagnosis not present

## 2020-03-18 DIAGNOSIS — I10 Essential (primary) hypertension: Secondary | ICD-10-CM | POA: Diagnosis not present

## 2020-03-18 DIAGNOSIS — E7800 Pure hypercholesterolemia, unspecified: Secondary | ICD-10-CM | POA: Diagnosis not present

## 2020-03-18 DIAGNOSIS — E782 Mixed hyperlipidemia: Secondary | ICD-10-CM | POA: Diagnosis not present

## 2020-03-19 ENCOUNTER — Other Ambulatory Visit: Payer: Self-pay | Admitting: Internal Medicine

## 2020-03-19 DIAGNOSIS — Z1231 Encounter for screening mammogram for malignant neoplasm of breast: Secondary | ICD-10-CM

## 2020-04-21 ENCOUNTER — Other Ambulatory Visit: Payer: Self-pay

## 2020-04-21 ENCOUNTER — Ambulatory Visit: Payer: Medicare PPO | Admitting: Podiatry

## 2020-04-21 ENCOUNTER — Encounter: Payer: Self-pay | Admitting: Podiatry

## 2020-04-21 DIAGNOSIS — E663 Overweight: Secondary | ICD-10-CM | POA: Insufficient documentation

## 2020-04-21 DIAGNOSIS — E7800 Pure hypercholesterolemia, unspecified: Secondary | ICD-10-CM | POA: Insufficient documentation

## 2020-04-21 DIAGNOSIS — R32 Unspecified urinary incontinence: Secondary | ICD-10-CM | POA: Insufficient documentation

## 2020-04-21 DIAGNOSIS — M79674 Pain in right toe(s): Secondary | ICD-10-CM | POA: Diagnosis not present

## 2020-04-21 DIAGNOSIS — M79675 Pain in left toe(s): Secondary | ICD-10-CM

## 2020-04-21 DIAGNOSIS — M8580 Other specified disorders of bone density and structure, unspecified site: Secondary | ICD-10-CM | POA: Insufficient documentation

## 2020-04-21 DIAGNOSIS — Z0000 Encounter for general adult medical examination without abnormal findings: Secondary | ICD-10-CM | POA: Insufficient documentation

## 2020-04-21 DIAGNOSIS — H04129 Dry eye syndrome of unspecified lacrimal gland: Secondary | ICD-10-CM | POA: Insufficient documentation

## 2020-04-21 DIAGNOSIS — B351 Tinea unguium: Secondary | ICD-10-CM | POA: Diagnosis not present

## 2020-04-21 DIAGNOSIS — E782 Mixed hyperlipidemia: Secondary | ICD-10-CM | POA: Insufficient documentation

## 2020-04-21 DIAGNOSIS — N649 Disorder of breast, unspecified: Secondary | ICD-10-CM | POA: Insufficient documentation

## 2020-04-28 NOTE — Progress Notes (Signed)
Subjective: Glenda Roth is a pleasant 80 y.o. female patient seen today painful thick toenails that are difficult to trim. Pain interferes with ambulation. Aggravating factors include wearing enclosed shoe gear. Pain is relieved with periodic professional debridement.  PCP is Dr. Seward Carol. Last visit was 03/11/2020.  She voices no new pedal concerns on today's visit.  Allergies  Allergen Reactions  . Other Palpitations    Uncoded Allergy. Allergen: ANTIHISTAMINES AND DECONGESTANTS Uncoded Allergy. Allergen: ANTIHISTAMINES AND DECONGESTANTS  . Ace Inhibitors Cough    Other reaction(s): Cough, coughing  . Amlodipine Besylate     Other reaction(s): leg swelling    Objective: Physical Exam  General: Glenda Roth is a pleasant 80 y.o. African American female, WD, WN in NAD. [name] x 3.   Vascular:  Capillary refill time to digits immediate b/l. Palpable pedal pulses b/l LE. Pedal hair diminished b/l. Lower extremity skin temperature gradient within normal limits. No pain with calf compression b/l. No edema noted b/l lower extremities.  Dermatological:  Pedal skin with normal turgor, texture and tone bilaterally. No open wounds bilaterally. No interdigital macerations bilaterally. Toenails 1-5 b/l elongated, discolored, dystrophic, thickened, crumbly with subungual debris and tenderness to dorsal palpation. No hyperkeratotic nor porokeratotic lesions present on today's visit.  Musculoskeletal:  Normal muscle strength 5/5 to all lower extremity muscle groups bilaterally. No pain crepitus or joint limitation noted with ROM b/l. 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 Hammertoes noted to the b/l lower extremities.  Neurological:  Protective sensation intact 5/5 intact bilaterally with 10g monofilament b/l. Vibratory sensation intact b/l. Clonus negative b/l.  Assessment and Plan:  1. Pain due to onychomycosis of toenails of both feet   -Examined  patient. -No new findings. No new orders. -Toenails 1-5 b/l were debrided in length and girth with sterile nail nippers and dremel without iatrogenic bleeding.  -Patient to report any pedal injuries to medical professional immediately. -Patient to continue soft, supportive shoe gear daily. -Patient/POA to call should there be question/concern in the interim.  Return in about 3 months (around 07/22/2020).  Marzetta Board, DPM

## 2020-05-13 DIAGNOSIS — I10 Essential (primary) hypertension: Secondary | ICD-10-CM | POA: Diagnosis not present

## 2020-05-13 DIAGNOSIS — E663 Overweight: Secondary | ICD-10-CM | POA: Diagnosis not present

## 2020-05-13 DIAGNOSIS — E7800 Pure hypercholesterolemia, unspecified: Secondary | ICD-10-CM | POA: Diagnosis not present

## 2020-06-14 DIAGNOSIS — H40021 Open angle with borderline findings, high risk, right eye: Secondary | ICD-10-CM | POA: Diagnosis not present

## 2020-06-14 DIAGNOSIS — H401121 Primary open-angle glaucoma, left eye, mild stage: Secondary | ICD-10-CM | POA: Diagnosis not present

## 2020-06-25 DIAGNOSIS — I10 Essential (primary) hypertension: Secondary | ICD-10-CM | POA: Diagnosis not present

## 2020-06-25 DIAGNOSIS — E7800 Pure hypercholesterolemia, unspecified: Secondary | ICD-10-CM | POA: Diagnosis not present

## 2020-06-25 DIAGNOSIS — H409 Unspecified glaucoma: Secondary | ICD-10-CM | POA: Diagnosis not present

## 2020-06-25 DIAGNOSIS — E782 Mixed hyperlipidemia: Secondary | ICD-10-CM | POA: Diagnosis not present

## 2020-08-04 ENCOUNTER — Other Ambulatory Visit: Payer: Self-pay

## 2020-08-04 ENCOUNTER — Ambulatory Visit (INDEPENDENT_AMBULATORY_CARE_PROVIDER_SITE_OTHER): Payer: Medicare PPO | Admitting: Podiatry

## 2020-08-04 ENCOUNTER — Encounter: Payer: Self-pay | Admitting: Podiatry

## 2020-08-04 DIAGNOSIS — M79675 Pain in left toe(s): Secondary | ICD-10-CM | POA: Diagnosis not present

## 2020-08-04 DIAGNOSIS — M79674 Pain in right toe(s): Secondary | ICD-10-CM

## 2020-08-04 DIAGNOSIS — B351 Tinea unguium: Secondary | ICD-10-CM | POA: Diagnosis not present

## 2020-08-04 DIAGNOSIS — M205X1 Other deformities of toe(s) (acquired), right foot: Secondary | ICD-10-CM

## 2020-08-06 ENCOUNTER — Other Ambulatory Visit: Payer: Self-pay

## 2020-08-06 ENCOUNTER — Ambulatory Visit
Admission: RE | Admit: 2020-08-06 | Discharge: 2020-08-06 | Disposition: A | Discharge status: Home / Self Care | Payer: Medicare PPO | Source: Ambulatory Visit | Attending: Internal Medicine | Admitting: Internal Medicine

## 2020-08-06 DIAGNOSIS — Z1231 Encounter for screening mammogram for malignant neoplasm of breast: Secondary | ICD-10-CM

## 2020-08-06 DIAGNOSIS — M8580 Other specified disorders of bone density and structure, unspecified site: Secondary | ICD-10-CM

## 2020-08-06 DIAGNOSIS — M85852 Other specified disorders of bone density and structure, left thigh: Secondary | ICD-10-CM | POA: Diagnosis not present

## 2020-08-06 DIAGNOSIS — Z780 Asymptomatic menopausal state: Secondary | ICD-10-CM | POA: Diagnosis not present

## 2020-08-10 NOTE — Progress Notes (Signed)
Subjective: Glenda Roth is a pleasant 80 y.o. female patient seen today painful thick toenails that are difficult to trim. Pain interferes with ambulation. Aggravating factors include wearing enclosed shoe gear. Pain is relieved with periodic professional debridement.  PCP is Dr. Seward Carol. Last visit was 06/25/2020  She voices no new pedal concerns on today's visit.  Allergies  Allergen Reactions   Other Palpitations    Uncoded Allergy. Allergen: ANTIHISTAMINES AND DECONGESTANTS Uncoded Allergy. Allergen: ANTIHISTAMINES AND DECONGESTANTS   Ace Inhibitors Cough    Other reaction(s): Cough, coughing   Amlodipine Besylate     Other reaction(s): leg swelling Other reaction(s): leg swelling   Lisinopril     Other reaction(s): coughing    Objective: Physical Exam  General: Glenda Roth is a pleasant 80 y.o. African American female, WD, WN in NAD. [name] x 3.   Vascular:  Capillary refill time to digits immediate b/l. Palpable pedal pulses b/l LE. Pedal hair diminished b/l. Lower extremity skin temperature gradient within normal limits. No pain with calf compression b/l. No edema noted b/l lower extremities.  Dermatological:  Pedal skin with normal turgor, texture and tone bilaterally. No open wounds bilaterally. No interdigital macerations bilaterally. Toenails 1-5 b/l elongated, discolored, dystrophic, thickened, crumbly with subungual debris and tenderness to dorsal palpation. No hyperkeratotic nor porokeratotic lesions present on today's visit.  Musculoskeletal:  Normal muscle strength 5/5 to all lower extremity muscle groups bilaterally. Palpable exostosis noted !st MP joint of right foot. Hallux valgus with bunion deformity noted b/l feet. Hammertoe(s) noted to the b/l feet. Limited joint ROM to the 1st MPJ of the right foot.  Neurological:  Protective sensation intact 5/5 intact bilaterally with 10g monofilament b/l. Vibratory sensation intact b/l. Clonus negative  b/l.  Assessment and Plan:  1. Pain due to onychomycosis of toenails of both feet  2. Hallux limitus of right foot  -For dorsal spurring of 1st MPJ, recommended shoe gear with stretchable uppers. -Toenails 1-5 b/l were debrided in length and girth with sterile nail nippers and dremel without iatrogenic bleeding.  -Patient to report any pedal injuries to medical professional immediately. -Patient to continue soft, supportive shoe gear daily. -Patient/POA to call should there be question/concern in the interim.  Return in about 3 months (around 11/04/2020).  Marzetta Board, DPM

## 2020-11-12 ENCOUNTER — Ambulatory Visit: Payer: Medicare PPO | Admitting: Podiatry

## 2020-11-12 ENCOUNTER — Other Ambulatory Visit: Payer: Self-pay

## 2020-11-12 DIAGNOSIS — M79674 Pain in right toe(s): Secondary | ICD-10-CM | POA: Diagnosis not present

## 2020-11-12 DIAGNOSIS — B351 Tinea unguium: Secondary | ICD-10-CM

## 2020-11-12 DIAGNOSIS — M79675 Pain in left toe(s): Secondary | ICD-10-CM | POA: Diagnosis not present

## 2020-11-16 ENCOUNTER — Encounter: Payer: Self-pay | Admitting: Podiatry

## 2020-11-16 NOTE — Progress Notes (Signed)
  Subjective:  Patient ID: Glenda Roth, female    DOB: Apr 04, 1940,  MRN: [id number]  80 y.o. female presents thick, elongated toenails b/l feet which are tender when wearing enclosed shoe gear.  She notes no new pedal problems on today's visit.  PCP is Seward Carol, MD , and last visit was March, 2022.Marland Kitchen  Allergies  Allergen Reactions   Other Palpitations    Uncoded Allergy. Allergen: ANTIHISTAMINES AND DECONGESTANTS Uncoded Allergy. Allergen: ANTIHISTAMINES AND DECONGESTANTS   Ace Inhibitors Cough    Other reaction(s): Cough, coughing   Amlodipine Besylate     Other reaction(s): leg swelling Other reaction(s): leg swelling   Lisinopril     Other reaction(s): coughing    Review of Systems: Negative except as noted in the HPI.   Objective:  Vascular Examination: Pedal hair sparse. Vascular status intact b/l with palpable pedal pulses. CFT immediate b/l. No edema. No pain with calf compression b/l. Skin temperature gradient WNL b/l.   Neurological Examination: Protective sensation intact 5/5 intact bilaterally with 10g monofilament b/l. Vibratory sensation intact b/l.  Dermatological Examination: Skin warm and supple b/l lower extremities. No open wounds b/l lower extremities. No interdigital macerations b/l lower extremities. Toenails 1-5 b/l elongated, discolored, dystrophic, thickened, crumbly with subungual debris and tenderness to dorsal palpation.  Musculoskeletal Examination: Normal muscle strength 5/5 to all lower extremity muscle groups bilaterally. Hammertoe(s) noted to the 2-5 bilaterally. Limited joint ROM to the 1st MPJ right foot.  Radiographs: None Assessment:   1. Pain due to onychomycosis of toenails of both feet    Plan:  -No new findings. No new orders. -Patient to continue soft, supportive shoe gear daily. -Toenails 1-5 b/l were debrided in length and girth with sterile nail nippers and dremel without iatrogenic bleeding.  -Patient to report any  pedal injuries to medical professional immediately. -Patient/POA to call should there be question/concern in the interim.  Return in about 3 months (around 02/11/2021).  Marzetta Board, DPM

## 2020-11-22 DIAGNOSIS — I10 Essential (primary) hypertension: Secondary | ICD-10-CM | POA: Diagnosis not present

## 2020-11-22 DIAGNOSIS — Z1211 Encounter for screening for malignant neoplasm of colon: Secondary | ICD-10-CM | POA: Diagnosis not present

## 2020-11-22 DIAGNOSIS — Z23 Encounter for immunization: Secondary | ICD-10-CM | POA: Diagnosis not present

## 2020-11-22 DIAGNOSIS — M8588 Other specified disorders of bone density and structure, other site: Secondary | ICD-10-CM | POA: Diagnosis not present

## 2020-11-22 DIAGNOSIS — Z5181 Encounter for therapeutic drug level monitoring: Secondary | ICD-10-CM | POA: Diagnosis not present

## 2020-11-22 DIAGNOSIS — E663 Overweight: Secondary | ICD-10-CM | POA: Diagnosis not present

## 2020-11-22 DIAGNOSIS — Z0000 Encounter for general adult medical examination without abnormal findings: Secondary | ICD-10-CM | POA: Diagnosis not present

## 2020-11-22 DIAGNOSIS — E7800 Pure hypercholesterolemia, unspecified: Secondary | ICD-10-CM | POA: Diagnosis not present

## 2020-11-22 DIAGNOSIS — H409 Unspecified glaucoma: Secondary | ICD-10-CM | POA: Diagnosis not present

## 2020-11-25 DIAGNOSIS — Z1211 Encounter for screening for malignant neoplasm of colon: Secondary | ICD-10-CM | POA: Diagnosis not present

## 2020-12-13 DIAGNOSIS — H401131 Primary open-angle glaucoma, bilateral, mild stage: Secondary | ICD-10-CM | POA: Diagnosis not present

## 2020-12-29 DIAGNOSIS — E7800 Pure hypercholesterolemia, unspecified: Secondary | ICD-10-CM | POA: Diagnosis not present

## 2020-12-29 DIAGNOSIS — E782 Mixed hyperlipidemia: Secondary | ICD-10-CM | POA: Diagnosis not present

## 2020-12-29 DIAGNOSIS — H409 Unspecified glaucoma: Secondary | ICD-10-CM | POA: Diagnosis not present

## 2020-12-29 DIAGNOSIS — I10 Essential (primary) hypertension: Secondary | ICD-10-CM | POA: Diagnosis not present

## 2021-02-17 DIAGNOSIS — H04129 Dry eye syndrome of unspecified lacrimal gland: Secondary | ICD-10-CM | POA: Diagnosis not present

## 2021-02-17 DIAGNOSIS — E669 Obesity, unspecified: Secondary | ICD-10-CM | POA: Diagnosis not present

## 2021-02-17 DIAGNOSIS — Z803 Family history of malignant neoplasm of breast: Secondary | ICD-10-CM | POA: Diagnosis not present

## 2021-02-17 DIAGNOSIS — H409 Unspecified glaucoma: Secondary | ICD-10-CM | POA: Diagnosis not present

## 2021-02-17 DIAGNOSIS — R32 Unspecified urinary incontinence: Secondary | ICD-10-CM | POA: Diagnosis not present

## 2021-02-17 DIAGNOSIS — M1990 Unspecified osteoarthritis, unspecified site: Secondary | ICD-10-CM | POA: Diagnosis not present

## 2021-02-17 DIAGNOSIS — I10 Essential (primary) hypertension: Secondary | ICD-10-CM | POA: Diagnosis not present

## 2021-02-17 DIAGNOSIS — J309 Allergic rhinitis, unspecified: Secondary | ICD-10-CM | POA: Diagnosis not present

## 2021-02-17 DIAGNOSIS — E785 Hyperlipidemia, unspecified: Secondary | ICD-10-CM | POA: Diagnosis not present

## 2021-03-14 ENCOUNTER — Ambulatory Visit: Payer: Medicare PPO | Admitting: Podiatry

## 2021-03-14 ENCOUNTER — Encounter: Payer: Self-pay | Admitting: Podiatry

## 2021-03-14 ENCOUNTER — Other Ambulatory Visit: Payer: Self-pay

## 2021-03-14 DIAGNOSIS — B351 Tinea unguium: Secondary | ICD-10-CM | POA: Diagnosis not present

## 2021-03-14 DIAGNOSIS — M79674 Pain in right toe(s): Secondary | ICD-10-CM

## 2021-03-14 DIAGNOSIS — L84 Corns and callosities: Secondary | ICD-10-CM

## 2021-03-14 DIAGNOSIS — R7303 Prediabetes: Secondary | ICD-10-CM

## 2021-03-14 DIAGNOSIS — M79675 Pain in left toe(s): Secondary | ICD-10-CM

## 2021-03-20 ENCOUNTER — Encounter: Payer: Self-pay | Admitting: Podiatry

## 2021-03-20 NOTE — Progress Notes (Signed)
°  Subjective:  Patient ID: Glenda Roth, female    DOB: May 15, 1940,  MRN: [id number]  Glenda Roth presents to clinic today for painful elongated mycotic toenails 1-5 bilaterally which are tender when wearing enclosed shoe gear. Pain is relieved with periodic professional debridement.  New problem(s): None.   PCP is Seward Carol, MD , and last visit was .12/29/2020  Allergies  Allergen Reactions   Other Palpitations    Uncoded Allergy. Allergen: ANTIHISTAMINES AND DECONGESTANTS Uncoded Allergy. Allergen: ANTIHISTAMINES AND DECONGESTANTS   Ace Inhibitors Cough    Other reaction(s): Cough, coughing   Amlodipine Besylate     Other reaction(s): leg swelling Other reaction(s): leg swelling   Lisinopril     Other reaction(s): coughing    Review of Systems: Negative except as noted in the HPI. Objective:   Constitutional Glenda Roth is a pleasant 81 y.o. African American female, in NAD. [name] x 3.   Vascular CFT immediate b/l LE. Palpable DP/PT pulses b/l LE. Digital hair sparse b/l. Skin temperature gradient WNL b/l. No pain with calf compression b/l. No edema noted b/l. No cyanosis or clubbing noted b/l LE.  Neurologic Normal speech. Oriented to person, place, and time. Protective sensation intact 5/5 intact bilaterally with 10g monofilament b/l.  Dermatologic Pedal integument with normal turgor, texture and tone b/l LE. No open wounds b/l. No interdigital macerations b/l. Toenails 1-5 b/l elongated, thickened, discolored with subungual debris. +Tenderness with dorsal palpation of nailplates. No hyperkeratotic or porokeratotic lesions present.  Orthopedic: Normal muscle strength 5/5 to all lower extremity muscle groups bilaterally. Plantar fat pad atrophy of forefoot area b/l lower extremities.. No pain, crepitus or joint limitation noted with ROM b/l LE.  Patient ambulates independently without assistive aids.   Radiographs: None  Last A1c: No flowsheet data found.    Assessment:   1. Pain due to onychomycosis of toenails of both feet    Plan:  Patient was evaluated and treated and all questions answered. Consent given for treatment as described below: -Mycotic toenails 1-5 bilaterally were debrided in length and girth with sterile nail nippers and dremel without iatrogenic bleeding. -Patient/POA to call should there be question/concern in the interim.  Return in about 3 months (around 06/12/2021).  Marzetta Board, DPM

## 2021-04-04 ENCOUNTER — Other Ambulatory Visit: Payer: Self-pay | Admitting: Internal Medicine

## 2021-04-04 DIAGNOSIS — Z1231 Encounter for screening mammogram for malignant neoplasm of breast: Secondary | ICD-10-CM

## 2021-04-14 DIAGNOSIS — H401131 Primary open-angle glaucoma, bilateral, mild stage: Secondary | ICD-10-CM | POA: Diagnosis not present

## 2021-05-23 DIAGNOSIS — E663 Overweight: Secondary | ICD-10-CM | POA: Diagnosis not present

## 2021-05-23 DIAGNOSIS — E7800 Pure hypercholesterolemia, unspecified: Secondary | ICD-10-CM | POA: Diagnosis not present

## 2021-05-23 DIAGNOSIS — M25569 Pain in unspecified knee: Secondary | ICD-10-CM | POA: Diagnosis not present

## 2021-05-23 DIAGNOSIS — I10 Essential (primary) hypertension: Secondary | ICD-10-CM | POA: Diagnosis not present

## 2021-06-02 DIAGNOSIS — M1711 Unilateral primary osteoarthritis, right knee: Secondary | ICD-10-CM | POA: Diagnosis not present

## 2021-06-13 ENCOUNTER — Ambulatory Visit: Payer: Medicare PPO | Admitting: Podiatry

## 2021-06-13 DIAGNOSIS — H401131 Primary open-angle glaucoma, bilateral, mild stage: Secondary | ICD-10-CM | POA: Diagnosis not present

## 2021-06-27 ENCOUNTER — Ambulatory Visit: Payer: Medicare PPO | Admitting: Podiatry

## 2021-06-27 ENCOUNTER — Encounter: Payer: Self-pay | Admitting: Podiatry

## 2021-06-27 DIAGNOSIS — B351 Tinea unguium: Secondary | ICD-10-CM | POA: Diagnosis not present

## 2021-06-27 DIAGNOSIS — M79675 Pain in left toe(s): Secondary | ICD-10-CM

## 2021-06-27 DIAGNOSIS — M79674 Pain in right toe(s): Secondary | ICD-10-CM | POA: Diagnosis not present

## 2021-07-06 NOTE — Progress Notes (Signed)
?  Subjective:  ?Patient ID: Glenda Roth, female    DOB: 07-12-1940,  MRN: [id number] ? ?Glenda Roth presents to clinic today for painful thick toenails that are difficult to trim. Pain interferes with ambulation. Aggravating factors include wearing enclosed shoe gear. Pain is relieved with periodic professional debridement. ? ?New problem(s): None.  ? ?PCP is Seward Carol, MD , and last visit was May 23, 2021. ? ?Allergies  ?Allergen Reactions  ? Other Palpitations  ?  Uncoded Allergy. Allergen: ANTIHISTAMINES AND DECONGESTANTS ?Uncoded Allergy. Allergen: ANTIHISTAMINES AND DECONGESTANTS  ? Ace Inhibitors Cough  ?  Other reaction(s): Cough, coughing  ? Amlodipine Besylate   ?  Other reaction(s): leg swelling ?Other reaction(s): leg swelling  ? Lisinopril   ?  Other reaction(s): coughing  ? ? ?Review of Systems: Negative except as noted in the HPI. ? ?Objective: No changes noted in today's physical examination. ? ?Constitutional Glenda Roth is a pleasant 81 y.o. African American female, in NAD. [name] x 3.   ?Vascular CFT immediate b/l LE. Palpable DP/PT pulses b/l LE. Digital hair sparse b/l. Skin temperature gradient WNL b/l. No pain with calf compression b/l. No edema noted b/l. No cyanosis or clubbing noted b/l LE.  ?Neurologic Normal speech. Oriented to person, place, and time. Protective sensation intact 5/5 intact bilaterally with 10g monofilament b/l.  ?Dermatologic Pedal integument with normal turgor, texture and tone b/l LE. No open wounds b/l. No interdigital macerations b/l. Toenails 1-5 b/l elongated, thickened, discolored with subungual debris. +Tenderness with dorsal palpation of nailplates. No hyperkeratotic or porokeratotic lesions present.  ?Orthopedic: Normal muscle strength 5/5 to all lower extremity muscle groups bilaterally. Plantar fat pad atrophy of forefoot area b/l lower extremities.. No pain, crepitus or joint limitation noted with ROM b/l LE.  Patient ambulates independently  without assistive aids.  ? ?Radiographs: None ? ?Assessment/Plan: ?1. Pain due to onychomycosis of toenails of both feet   ?-Examined patient. ?-Patient to continue soft, supportive shoe gear daily. ?-Toenails 1-5 b/l were debrided in length and girth with sterile nail nippers and dremel without iatrogenic bleeding.  ?-Patient/POA to call should there be question/concern in the interim.  ? ?No follow-ups on file. ? ?Marzetta Board, DPM  ?

## 2021-08-08 ENCOUNTER — Ambulatory Visit
Admission: RE | Admit: 2021-08-08 | Discharge: 2021-08-08 | Disposition: A | Discharge status: Home / Self Care | Payer: Medicare PPO | Source: Ambulatory Visit | Attending: Internal Medicine | Admitting: Internal Medicine

## 2021-08-08 DIAGNOSIS — Z1231 Encounter for screening mammogram for malignant neoplasm of breast: Secondary | ICD-10-CM

## 2021-09-05 DIAGNOSIS — M25561 Pain in right knee: Secondary | ICD-10-CM | POA: Insufficient documentation

## 2021-09-05 DIAGNOSIS — M25562 Pain in left knee: Secondary | ICD-10-CM | POA: Insufficient documentation

## 2021-09-12 DIAGNOSIS — M170 Bilateral primary osteoarthritis of knee: Secondary | ICD-10-CM | POA: Diagnosis not present

## 2021-09-19 DIAGNOSIS — M1712 Unilateral primary osteoarthritis, left knee: Secondary | ICD-10-CM | POA: Insufficient documentation

## 2021-09-19 DIAGNOSIS — M1711 Unilateral primary osteoarthritis, right knee: Secondary | ICD-10-CM | POA: Insufficient documentation

## 2021-09-20 DIAGNOSIS — H6123 Impacted cerumen, bilateral: Secondary | ICD-10-CM | POA: Diagnosis not present

## 2021-09-28 ENCOUNTER — Ambulatory Visit: Payer: Medicare PPO | Admitting: Podiatry

## 2021-09-28 DIAGNOSIS — B351 Tinea unguium: Secondary | ICD-10-CM | POA: Diagnosis not present

## 2021-09-28 DIAGNOSIS — M79675 Pain in left toe(s): Secondary | ICD-10-CM

## 2021-09-28 DIAGNOSIS — M79674 Pain in right toe(s): Secondary | ICD-10-CM

## 2021-10-03 DIAGNOSIS — H401131 Primary open-angle glaucoma, bilateral, mild stage: Secondary | ICD-10-CM | POA: Diagnosis not present

## 2021-10-04 ENCOUNTER — Encounter: Payer: Self-pay | Admitting: Podiatry

## 2021-10-04 NOTE — Progress Notes (Signed)
  Subjective:  Patient ID: Glenda Roth, female    DOB: 04/23/1940,  MRN: [id number]  Glenda Roth presents to clinic today for painful elongated mycotic toenails 1-5 bilaterally which are tender when wearing enclosed shoe gear. Pain is relieved with periodic professional debridement.  New problem(s): None.   PCP is Seward Carol, MD , and last visit was  August 16, 2021  Allergies  Allergen Reactions   Other Palpitations    Uncoded Allergy. Allergen: ANTIHISTAMINES AND DECONGESTANTS Uncoded Allergy. Allergen: ANTIHISTAMINES AND DECONGESTANTS   Ace Inhibitors Cough    Other reaction(s): Cough, coughing   Amlodipine Besylate     Other reaction(s): leg swelling Other reaction(s): leg swelling   Lisinopril     Other reaction(s): coughing    Review of Systems: Negative except as noted in the HPI.  Objective: No changes noted in today's physical examination. Constitutional Glenda Roth is a pleasant 81 y.o. African American female, in NAD. [name] x 3.   Vascular CFT immediate b/l LE. Palpable DP/PT pulses b/l LE. Digital hair sparse b/l. Skin temperature gradient WNL b/l. No pain with calf compression b/l. No edema noted b/l. No cyanosis or clubbing noted b/l LE.  Neurologic Normal speech. Oriented to person, place, and time. Protective sensation intact 5/5 intact bilaterally with 10g monofilament b/l.  Dermatologic Pedal integument with normal turgor, texture and tone b/l LE. No open wounds b/l. No interdigital macerations b/l. Toenails 1-5 b/l elongated, thickened, discolored with subungual debris. +Tenderness with dorsal palpation of nailplates. No hyperkeratotic or porokeratotic lesions present.  Orthopedic: Normal muscle strength 5/5 to all lower extremity muscle groups bilaterally. Plantar fat pad atrophy of forefoot area b/l lower extremities.. No pain, crepitus or joint limitation noted with ROM b/l LE.  Patient ambulates independently without assistive aids.   Radiographs:  None  Assessment/Plan: No diagnosis found.  -Patient was evaluated and treated. All patient's and/or POA's questions/concerns answered on today's visit. -No new findings. No new orders. -Mycotic toenails 1-5 bilaterally were debrided in length and girth with sterile nail nippers and dremel without incident. -Patient/POA to call should there be question/concern in the interim.   Return in about 3 months (around 12/29/2021).  Marzetta Board, DPM

## 2021-10-20 DIAGNOSIS — R252 Cramp and spasm: Secondary | ICD-10-CM | POA: Diagnosis not present

## 2021-10-26 DIAGNOSIS — H04129 Dry eye syndrome of unspecified lacrimal gland: Secondary | ICD-10-CM | POA: Diagnosis not present

## 2021-10-26 DIAGNOSIS — K219 Gastro-esophageal reflux disease without esophagitis: Secondary | ICD-10-CM | POA: Diagnosis not present

## 2021-10-26 DIAGNOSIS — J302 Other seasonal allergic rhinitis: Secondary | ICD-10-CM | POA: Diagnosis not present

## 2021-10-26 DIAGNOSIS — H409 Unspecified glaucoma: Secondary | ICD-10-CM | POA: Diagnosis not present

## 2021-10-26 DIAGNOSIS — M1990 Unspecified osteoarthritis, unspecified site: Secondary | ICD-10-CM | POA: Diagnosis not present

## 2021-10-26 DIAGNOSIS — E785 Hyperlipidemia, unspecified: Secondary | ICD-10-CM | POA: Diagnosis not present

## 2021-10-26 DIAGNOSIS — I951 Orthostatic hypotension: Secondary | ICD-10-CM | POA: Diagnosis not present

## 2021-10-26 DIAGNOSIS — I10 Essential (primary) hypertension: Secondary | ICD-10-CM | POA: Diagnosis not present

## 2021-10-26 DIAGNOSIS — E669 Obesity, unspecified: Secondary | ICD-10-CM | POA: Diagnosis not present

## 2022-01-16 DIAGNOSIS — Z0000 Encounter for general adult medical examination without abnormal findings: Secondary | ICD-10-CM | POA: Diagnosis not present

## 2022-01-16 DIAGNOSIS — M8588 Other specified disorders of bone density and structure, other site: Secondary | ICD-10-CM | POA: Diagnosis not present

## 2022-01-16 DIAGNOSIS — I10 Essential (primary) hypertension: Secondary | ICD-10-CM | POA: Diagnosis not present

## 2022-01-16 DIAGNOSIS — Z5181 Encounter for therapeutic drug level monitoring: Secondary | ICD-10-CM | POA: Diagnosis not present

## 2022-01-16 DIAGNOSIS — E663 Overweight: Secondary | ICD-10-CM | POA: Diagnosis not present

## 2022-01-16 DIAGNOSIS — M25569 Pain in unspecified knee: Secondary | ICD-10-CM | POA: Diagnosis not present

## 2022-01-16 DIAGNOSIS — E7800 Pure hypercholesterolemia, unspecified: Secondary | ICD-10-CM | POA: Diagnosis not present

## 2022-01-16 DIAGNOSIS — H409 Unspecified glaucoma: Secondary | ICD-10-CM | POA: Diagnosis not present

## 2022-01-18 ENCOUNTER — Encounter: Payer: Self-pay | Admitting: Podiatry

## 2022-01-18 ENCOUNTER — Ambulatory Visit: Payer: Medicare PPO | Admitting: Podiatry

## 2022-01-18 DIAGNOSIS — B351 Tinea unguium: Secondary | ICD-10-CM

## 2022-01-18 DIAGNOSIS — M79674 Pain in right toe(s): Secondary | ICD-10-CM | POA: Diagnosis not present

## 2022-01-18 DIAGNOSIS — M79675 Pain in left toe(s): Secondary | ICD-10-CM | POA: Diagnosis not present

## 2022-01-21 NOTE — Progress Notes (Signed)
  Subjective:  Patient ID: Glenda Roth, female    DOB: 09/06/1940,  MRN: [id number]  Glenda Roth presents to clinic today for painful thick toenails that are difficult to trim. Pain interferes with ambulation. Aggravating factors include wearing enclosed shoe gear. Pain is relieved with periodic professional debridement.  Chief Complaint  Patient presents with   Nail Problem    Routine foot care PCP-Polite PCP VST-01/16/2022   New problem(s): None.   PCP is Seward Carol, MD.  Allergies  Allergen Reactions   Other Palpitations    Uncoded Allergy. Allergen: ANTIHISTAMINES AND DECONGESTANTS Uncoded Allergy. Allergen: ANTIHISTAMINES AND DECONGESTANTS   Ace Inhibitors Cough    Other reaction(s): Cough, coughing   Amlodipine Besylate     Other reaction(s): leg swelling Other reaction(s): leg swelling   Lisinopril     Other reaction(s): coughing    Review of Systems: Negative except as noted in the HPI.  Objective: No changes noted in today's physical examination.  There were no vitals filed for this visit.  ANALUCIA HUSH is a pleasant 81 y.o. female in NAD. Glenda Roth x 3.  Vascular CFT immediate b/l LE. Palpable DP/PT pulses b/l LE. Digital hair sparse b/l. Skin temperature gradient WNL b/l. No pain with calf compression b/l. No edema noted b/l. No cyanosis or clubbing noted b/l LE.  Neurologic Normal speech. Oriented to person, place, and time. Protective sensation intact 5/5 intact bilaterally with 10g monofilament b/l.  Dermatologic Pedal integument with normal turgor, texture and tone b/l LE. No open wounds b/l. No interdigital macerations b/l. Toenails 1-5 b/l elongated, thickened, discolored with subungual debris. +Tenderness with dorsal palpation of nailplates. No hyperkeratotic or porokeratotic lesions present.  Orthopedic: Normal muscle strength 5/5 to all lower extremity muscle groups bilaterally. Plantar fat pad atrophy of forefoot area b/l lower extremities.. No  pain, crepitus or joint limitation noted with ROM b/l LE.  Patient ambulates independently without assistive aids.   Radiographs: None  Assessment/Plan: 1. Pain due to onychomycosis of toenails of both feet     No orders of the defined types were placed in this encounter.   -Consent given for treatment as described below: -Examined patient. -Patient to continue soft, supportive shoe gear daily. -Toenails 1-5 b/l were debrided in length and girth with sterile nail nippers and dremel without iatrogenic bleeding.  -Patient/POA to call should there be question/concern in the interim.   Return in about 3 months (around 04/20/2022).  Marzetta Board, DPM

## 2022-02-03 DIAGNOSIS — H401131 Primary open-angle glaucoma, bilateral, mild stage: Secondary | ICD-10-CM | POA: Diagnosis not present

## 2022-03-29 ENCOUNTER — Other Ambulatory Visit: Payer: Self-pay | Admitting: Internal Medicine

## 2022-03-29 DIAGNOSIS — Z1231 Encounter for screening mammogram for malignant neoplasm of breast: Secondary | ICD-10-CM

## 2022-05-10 ENCOUNTER — Ambulatory Visit: Payer: Medicare PPO | Admitting: Podiatry

## 2022-05-10 VITALS — BP 163/86

## 2022-05-10 DIAGNOSIS — M79674 Pain in right toe(s): Secondary | ICD-10-CM

## 2022-05-10 DIAGNOSIS — B351 Tinea unguium: Secondary | ICD-10-CM | POA: Diagnosis not present

## 2022-05-10 DIAGNOSIS — M79675 Pain in left toe(s): Secondary | ICD-10-CM

## 2022-05-10 NOTE — Progress Notes (Unsigned)
  Subjective:  Patient ID: Glenda Roth, female    DOB: May 06, 1940,  MRN: [id number]  Glenda Roth presents to clinic today for {jgcomplaint:23593}  Chief Complaint  Patient presents with   Nail Problem    RFC PCP-Polite PCP VST-03/2022   New problem(s): None. {jgcomplaint:23593}  PCP is Seward Carol, MD.  Allergies  Allergen Reactions   Other Palpitations    Uncoded Allergy. Allergen: ANTIHISTAMINES AND DECONGESTANTS Uncoded Allergy. Allergen: ANTIHISTAMINES AND DECONGESTANTS   Ace Inhibitors Cough    Other reaction(s): Cough, coughing   Amlodipine Besylate     Other reaction(s): leg swelling Other reaction(s): leg swelling   Lisinopril     Other reaction(s): coughing    Review of Systems: Negative except as noted in the HPI.  Objective: No changes noted in today's physical examination. There were no vitals filed for this visit. Glenda Roth is a pleasant 82 y.o. female {jgbodyhabitus:24098} [name] x 3.   Vascular CFT immediate b/l LE. Palpable DP/PT pulses b/l LE. Digital hair sparse b/l. Skin temperature gradient WNL b/l. No pain with calf compression b/l. No edema noted b/l. No cyanosis or clubbing noted b/l LE.  Neurologic Normal speech. Oriented to person, place, and time. Protective sensation intact 5/5 intact bilaterally with 10g monofilament b/l.  Dermatologic Pedal integument with normal turgor, texture and tone b/l LE. No open wounds b/l. No interdigital macerations b/l. Toenails 1-5 b/l elongated, thickened, discolored with subungual debris. +Tenderness with dorsal palpation of nailplates. No hyperkeratotic or porokeratotic lesions present.  Orthopedic: Normal muscle strength 5/5 to all lower extremity muscle groups bilaterally. Plantar fat pad atrophy of forefoot area b/l lower extremities.. No pain, crepitus or joint limitation noted with ROM b/l LE.  Patient ambulates independently without assistive aids.   Radiographs: None  Assessment/Plan: No  diagnosis found.  No orders of the defined types were placed in this encounter.   None {Jgplan:23602::"-Patient/POA to call should there be question/concern in the interim."}   Return in about 3 months (around 08/10/2022).  Marzetta Board, DPM

## 2022-05-11 ENCOUNTER — Encounter: Payer: Self-pay | Admitting: Podiatry

## 2022-06-12 DIAGNOSIS — H04123 Dry eye syndrome of bilateral lacrimal glands: Secondary | ICD-10-CM | POA: Diagnosis not present

## 2022-06-12 DIAGNOSIS — H401131 Primary open-angle glaucoma, bilateral, mild stage: Secondary | ICD-10-CM | POA: Diagnosis not present

## 2022-06-12 DIAGNOSIS — H2513 Age-related nuclear cataract, bilateral: Secondary | ICD-10-CM | POA: Diagnosis not present

## 2022-07-18 DIAGNOSIS — M8588 Other specified disorders of bone density and structure, other site: Secondary | ICD-10-CM | POA: Diagnosis not present

## 2022-07-18 DIAGNOSIS — I10 Essential (primary) hypertension: Secondary | ICD-10-CM | POA: Diagnosis not present

## 2022-07-18 DIAGNOSIS — E7800 Pure hypercholesterolemia, unspecified: Secondary | ICD-10-CM | POA: Diagnosis not present

## 2022-07-18 DIAGNOSIS — M25561 Pain in right knee: Secondary | ICD-10-CM | POA: Diagnosis not present

## 2022-07-18 DIAGNOSIS — M25562 Pain in left knee: Secondary | ICD-10-CM | POA: Diagnosis not present

## 2022-07-18 DIAGNOSIS — H409 Unspecified glaucoma: Secondary | ICD-10-CM | POA: Diagnosis not present

## 2022-07-18 DIAGNOSIS — E663 Overweight: Secondary | ICD-10-CM | POA: Diagnosis not present

## 2022-07-24 ENCOUNTER — Other Ambulatory Visit: Payer: Self-pay | Admitting: Internal Medicine

## 2022-07-24 DIAGNOSIS — M8580 Other specified disorders of bone density and structure, unspecified site: Secondary | ICD-10-CM

## 2022-07-26 ENCOUNTER — Ambulatory Visit
Admission: RE | Admit: 2022-07-26 | Discharge: 2022-07-26 | Disposition: A | Discharge status: Home / Self Care | Payer: Medicare PPO | Source: Ambulatory Visit | Attending: Internal Medicine | Admitting: Internal Medicine

## 2022-07-26 DIAGNOSIS — M8580 Other specified disorders of bone density and structure, unspecified site: Secondary | ICD-10-CM

## 2022-07-26 DIAGNOSIS — M810 Age-related osteoporosis without current pathological fracture: Secondary | ICD-10-CM | POA: Diagnosis not present

## 2022-08-10 ENCOUNTER — Ambulatory Visit
Admission: RE | Admit: 2022-08-10 | Discharge: 2022-08-10 | Disposition: A | Discharge status: Home / Self Care | Payer: Medicare PPO | Source: Ambulatory Visit | Attending: Internal Medicine | Admitting: Internal Medicine

## 2022-08-10 DIAGNOSIS — Z1231 Encounter for screening mammogram for malignant neoplasm of breast: Secondary | ICD-10-CM

## 2022-08-15 ENCOUNTER — Other Ambulatory Visit: Payer: Self-pay | Admitting: Internal Medicine

## 2022-08-15 DIAGNOSIS — R928 Other abnormal and inconclusive findings on diagnostic imaging of breast: Secondary | ICD-10-CM

## 2022-08-21 ENCOUNTER — Ambulatory Visit: Payer: Medicare PPO

## 2022-08-21 ENCOUNTER — Ambulatory Visit
Admission: RE | Admit: 2022-08-21 | Discharge: 2022-08-21 | Disposition: A | Discharge status: Home / Self Care | Payer: Medicare PPO | Source: Ambulatory Visit | Attending: Internal Medicine | Admitting: Internal Medicine

## 2022-08-21 DIAGNOSIS — N6459 Other signs and symptoms in breast: Secondary | ICD-10-CM | POA: Diagnosis not present

## 2022-08-21 DIAGNOSIS — R928 Other abnormal and inconclusive findings on diagnostic imaging of breast: Secondary | ICD-10-CM

## 2022-09-20 ENCOUNTER — Ambulatory Visit: Payer: Medicare PPO | Admitting: Podiatry

## 2022-09-20 ENCOUNTER — Encounter: Payer: Self-pay | Admitting: Podiatry

## 2022-09-20 VITALS — BP 164/82

## 2022-09-20 DIAGNOSIS — M79674 Pain in right toe(s): Secondary | ICD-10-CM | POA: Diagnosis not present

## 2022-09-20 DIAGNOSIS — B351 Tinea unguium: Secondary | ICD-10-CM

## 2022-09-20 DIAGNOSIS — M79675 Pain in left toe(s): Secondary | ICD-10-CM | POA: Diagnosis not present

## 2022-09-24 NOTE — Progress Notes (Signed)
  Subjective:  Patient ID: Glenda Roth, female    DOB: 1940/06/04,  MRN: [id number]  Glenda Roth presents to clinic today for painful thick toenails that are difficult to trim. Pain interferes with ambulation. Aggravating factors include wearing enclosed shoe gear. Pain is relieved with periodic professional debridement.  Chief Complaint  Patient presents with   Nail Problem    rfc   New problem(s): None.   PCP is Renford Dills, MD.  Allergies  Allergen Reactions   Other Palpitations    Uncoded Allergy. Allergen: ANTIHISTAMINES AND DECONGESTANTS Uncoded Allergy. Allergen: ANTIHISTAMINES AND DECONGESTANTS   Ace Inhibitors Cough    Other reaction(s): Cough, coughing   Amlodipine Besylate     Other reaction(s): leg swelling Other reaction(s): leg swelling   Lisinopril     Other reaction(s): coughing   Rosuvastatin     Other Reaction(s): muscle cramps    Review of Systems: Negative except as noted in the HPI.  Objective: No changes noted in today's physical examination. Vitals:   09/20/22 1334  BP: (!) 164/82   Glenda Roth is a pleasant 82 y.o. female WD, WN in NAD. [name] x 3.  Vascular Examination: Capillary refill time immediate b/l. Vascular status intact b/l with palpable pedal pulses. Pedal hair sparse b/l. No pain with calf compression b/l. Skin temperature gradient WNL b/l. No cyanosis or clubbing b/l. No ischemia or gangrene noted b/l.   Neurological Examination: Sensation grossly intact b/l with 10 gram monofilament. Vibratory sensation intact b/l.   Dermatological Examination: Pedal skin with normal turgor, texture and tone b/l.  No open wounds. No interdigital macerations.   Toenails 1-5 b/l thick, discolored, elongated with subungual debris and pain on dorsal palpation.   No corns, calluses nor porokeratotic lesions noted.  Musculoskeletal Examination: Muscle strength 5/5 to all lower extremity muscle groups bilaterally. Plantar fat pad atrophy of  forefoot area b/l lower extremities.  Radiographs: None  Assessment/Plan: 1. Pain due to onychomycosis of toenails of both feet   -Consent given for treatment as described below: -Examined patient. -Patient to continue soft, supportive shoe gear daily. -Toenails 1-5 b/l were debrided in length and girth with sterile nail nippers and dremel without iatrogenic bleeding.  -Patient/POA to call should there be question/concern in the interim.   Return in about 3 months (around 12/21/2022).  Freddie Breech, DPM

## 2022-12-06 DIAGNOSIS — Z23 Encounter for immunization: Secondary | ICD-10-CM | POA: Diagnosis not present

## 2022-12-14 IMAGING — MG MM DIGITAL SCREENING BILAT W/ TOMO AND CAD
8 series · 8 of 24 positions shown · non-contrast
Comparison: Previous exam(s).

CLINICAL DATA: Screening.

EXAM:
DIGITAL SCREENING BILATERAL MAMMOGRAM WITH TOMOSYNTHESIS AND CAD
TECHNIQUE: Bilateral screening digital craniocaudal and mediolateral oblique
mammograms were obtained. Bilateral screening digital breast
tomosynthesis was performed. The images were evaluated with
computer-aided detection.

[R CC synth-2D]
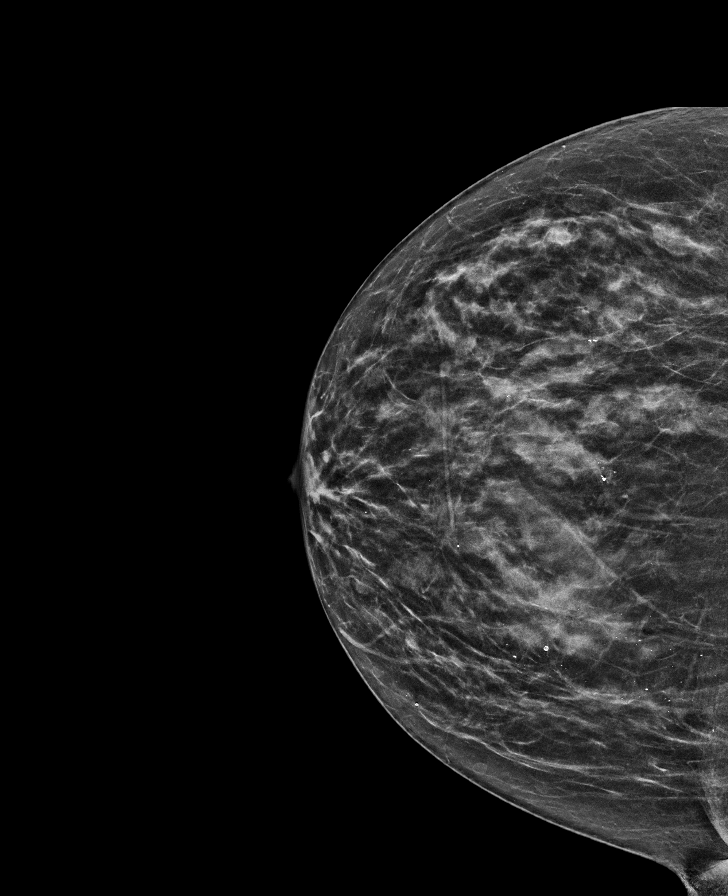

[R MLO synth-2D]
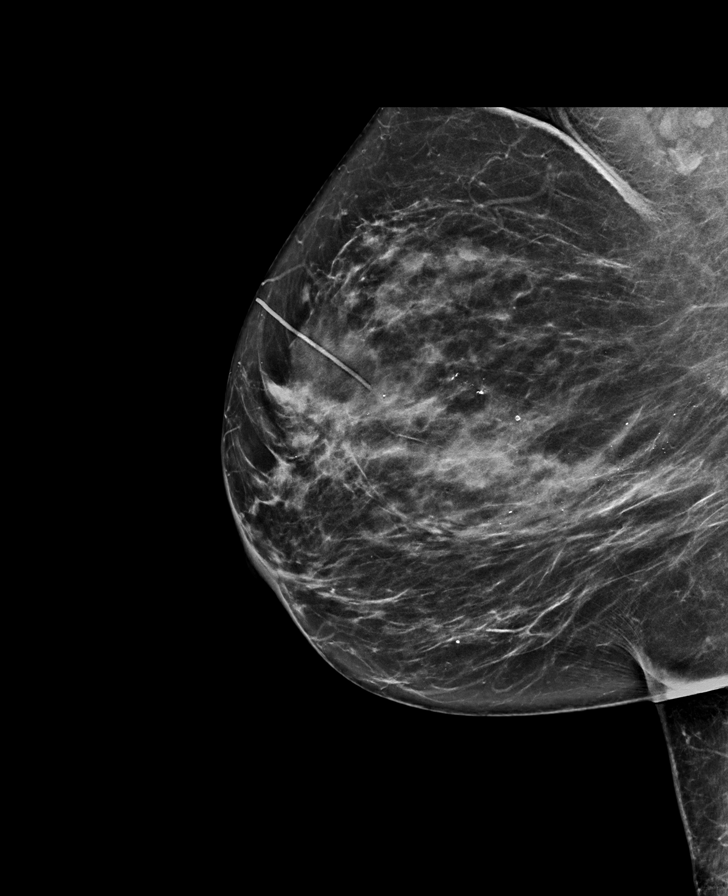

[L MLO synth-2D]
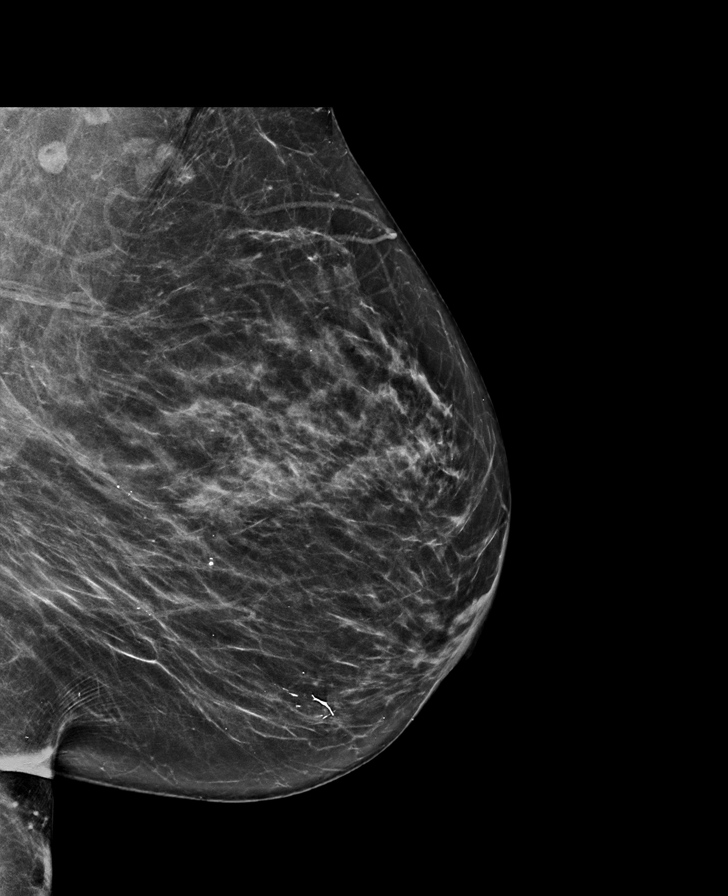

[L CC synth-2D]
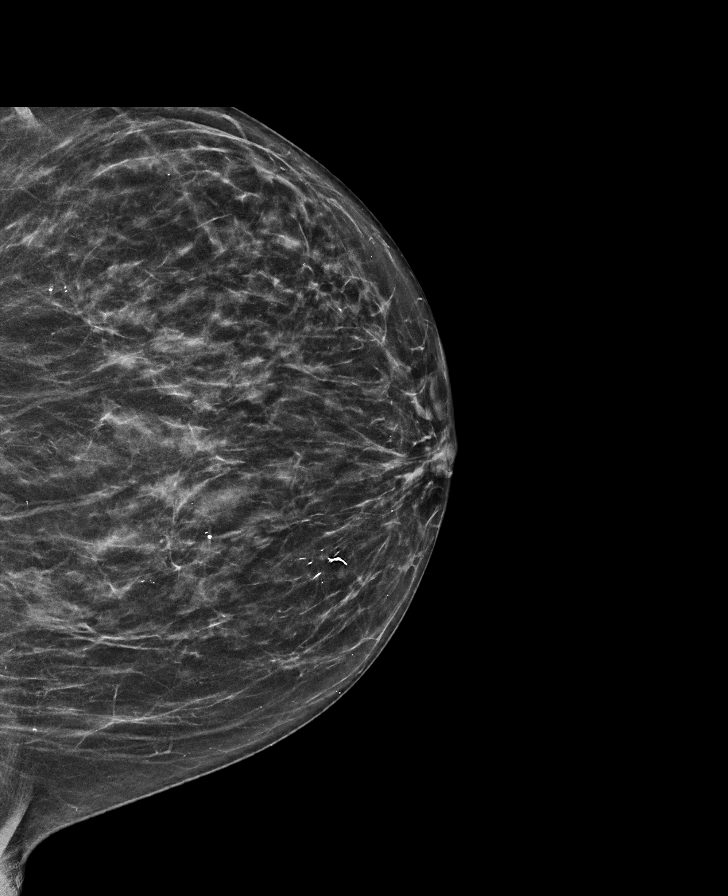

[R MLO tomo · tomo slice 35/68.0]
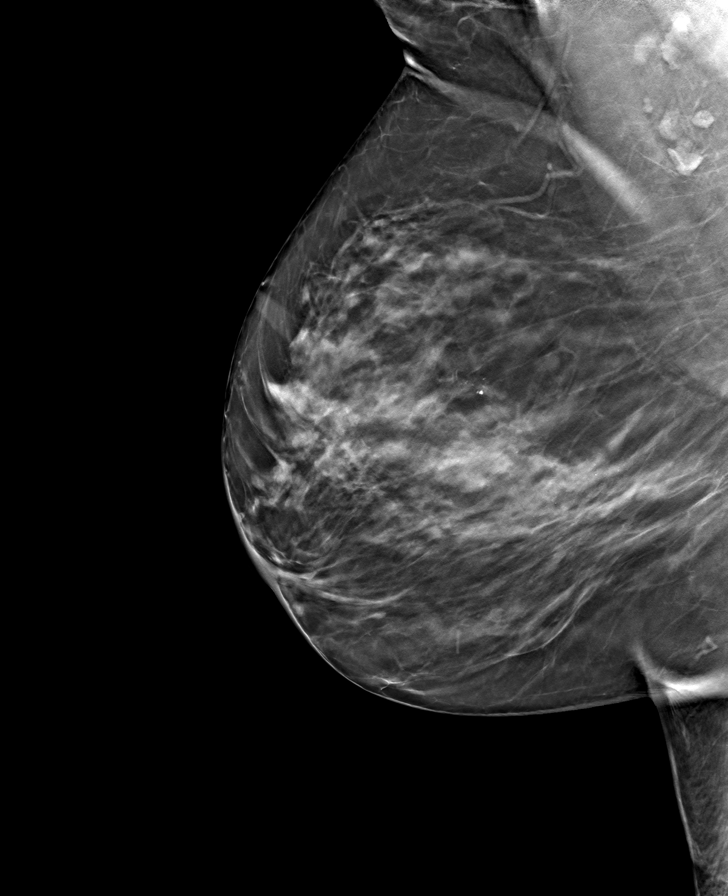

[L CC tomo · tomo slice 29/56.0]
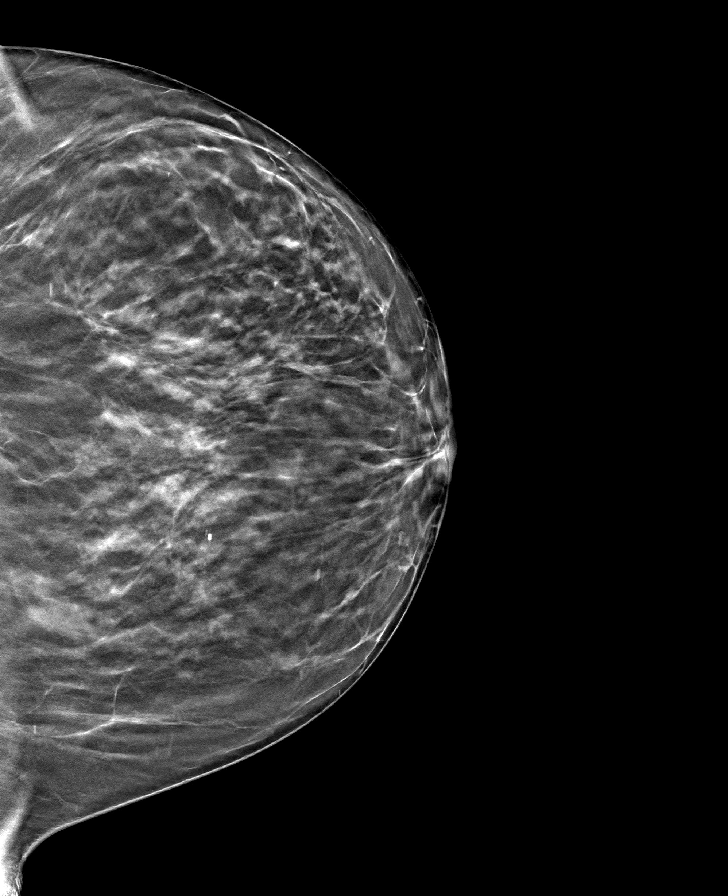

[R CC tomo · tomo slice 27/53.0]
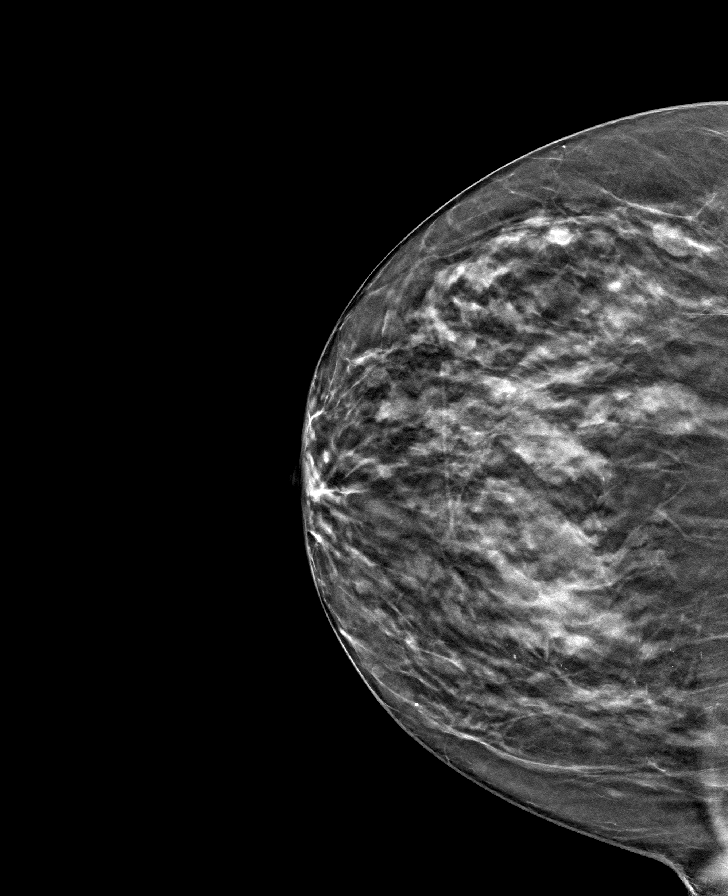

[L MLO tomo · tomo slice 35/68.0]
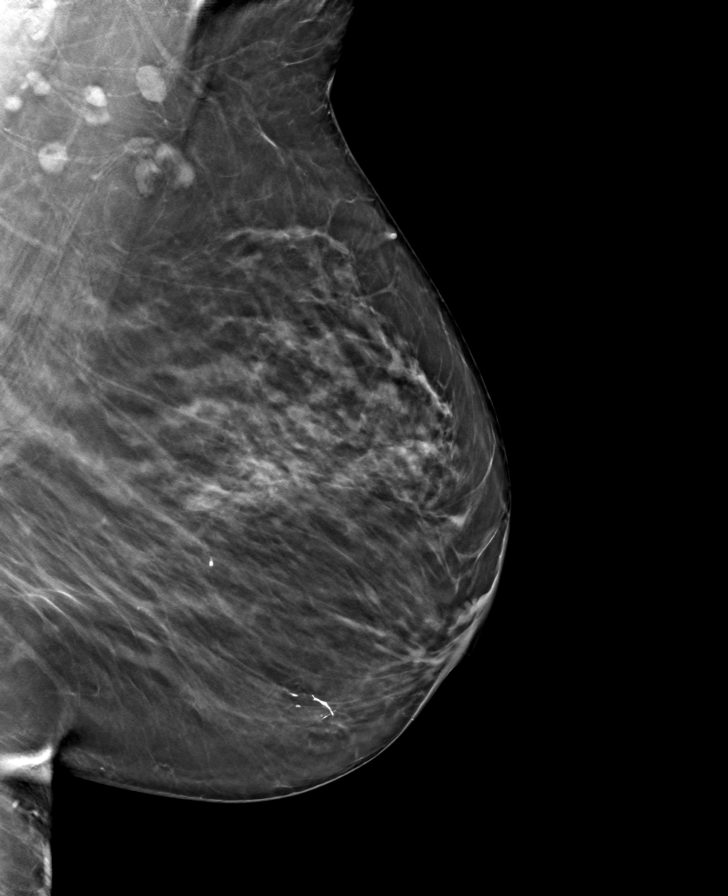

[8 of 24 positions shown; findings below may reference images not displayed]

ACR Breast Density Category c: The breast tissue is heterogeneously
dense, which may obscure small masses.
FINDINGS: There are no findings suspicious for malignancy.
IMPRESSION: No mammographic evidence of malignancy. A result letter of this
screening mammogram will be mailed directly to the patient.

RECOMMENDATION:
Screening mammogram in one year. (Code:Q3-W-BC3)

BI-RADS CATEGORY  1: Negative.

## 2022-12-25 DIAGNOSIS — H2513 Age-related nuclear cataract, bilateral: Secondary | ICD-10-CM | POA: Diagnosis not present

## 2022-12-25 DIAGNOSIS — H04123 Dry eye syndrome of bilateral lacrimal glands: Secondary | ICD-10-CM | POA: Diagnosis not present

## 2022-12-25 DIAGNOSIS — H401131 Primary open-angle glaucoma, bilateral, mild stage: Secondary | ICD-10-CM | POA: Diagnosis not present

## 2023-01-23 ENCOUNTER — Encounter: Payer: Self-pay | Admitting: Podiatry

## 2023-01-23 ENCOUNTER — Ambulatory Visit: Payer: Medicare PPO | Admitting: Podiatry

## 2023-01-23 DIAGNOSIS — M79675 Pain in left toe(s): Secondary | ICD-10-CM | POA: Diagnosis not present

## 2023-01-23 DIAGNOSIS — B351 Tinea unguium: Secondary | ICD-10-CM | POA: Diagnosis not present

## 2023-01-23 DIAGNOSIS — M79674 Pain in right toe(s): Secondary | ICD-10-CM | POA: Diagnosis not present

## 2023-01-23 DIAGNOSIS — S99922A Unspecified injury of left foot, initial encounter: Secondary | ICD-10-CM | POA: Diagnosis not present

## 2023-01-28 NOTE — Progress Notes (Signed)
  Subjective:  Patient ID: Glenda Roth, female    DOB: 1940-08-19,  MRN: [id number]  82 y.o. female presents painful elongated mycotic toenails 1-5 bilaterally which are tender when wearing enclosed shoe gear. Pain is relieved with periodic professional debridement.  Chief Complaint  Patient presents with   Foot Pain    Rfc,  PCP Dr. Nehemiah Settle,    New problem(s): Patient states she dropped a can on her left 2nd toe a few days ago. States it was sore, but she didn't remember seeing any blood after the injury.  PCP is Renford Dills, MD.  Allergies  Allergen Reactions   Other Palpitations    Uncoded Allergy. Allergen: ANTIHISTAMINES AND DECONGESTANTS Uncoded Allergy. Allergen: ANTIHISTAMINES AND DECONGESTANTS   Ace Inhibitors Cough    Other reaction(s): Cough, coughing   Amlodipine Besylate     Other reaction(s): leg swelling Other reaction(s): leg swelling   Lisinopril     Other reaction(s): coughing   Rosuvastatin     Other Reaction(s): muscle cramps    Review of Systems: Negative except as noted in the HPI.   Objective:  Glenda Roth is a pleasant 82 y.o. female WD, WN in NAD. [name] x 3.  Vascular Examination: Vascular status intact b/l with palpable pedal pulses. CFT immediate b/l. Pedal hair present. No edema. No pain with calf compression b/l. Skin temperature gradient WNL b/l. No varicosities noted. No cyanosis or clubbing noted.  Neurological Examination: Sensation grossly intact b/l with 10 gram monofilament. Vibratory sensation intact b/l.  Dermatological Examination: Left 2nd digit with subungual dried sanguinous drainage. There is a small superficial laceration of the lateral aspect of the left great toe. No erythema, no edema, no drainage.  Pedal skin with normal turgor, texture and tone b/l. No interdigital macerations noted. Toenails 1-5 b/l thick, discolored, elongated with subungual debris and pain on dorsal palpation. No hyperkeratotic lesions noted b/l.    Musculoskeletal Examination: Muscle strength 5/5 to b/l LE.  No pain, crepitus noted b/l. No gross pedal deformities. Patient ambulates independently without assistive aids.   Radiographs: None  Last A1c:       No data to display         Assessment:   1. Pain due to onychomycosis of toenails of both feet   2. Toe injury, left, initial encounter    Plan:  -Patient was evaluated today. All questions/concerns addressed on today's visit. -Continue supportive shoe gear daily. -Mycotic toenails 1-5 bilaterally were debrided in length and girth with sterile nail nippers and dremel without incident. -Left great toe cleaned with alcohol. TAO and light dressing applied. Patient to apply Neosporin to left great toe once daily until healed.. -Patient/POA to call should there be question/concern in the interim.  Return in about 3 months (around 04/23/2023).  Freddie Breech, DPM      [place] LOCATION: [address] [place] [address]                   Office [phone]   [facility] LOCATION: [address] [place], [place] [address] Office [phone]

## 2023-02-02 DIAGNOSIS — M8588 Other specified disorders of bone density and structure, other site: Secondary | ICD-10-CM | POA: Diagnosis not present

## 2023-02-02 DIAGNOSIS — E7800 Pure hypercholesterolemia, unspecified: Secondary | ICD-10-CM | POA: Diagnosis not present

## 2023-02-02 DIAGNOSIS — E663 Overweight: Secondary | ICD-10-CM | POA: Diagnosis not present

## 2023-02-02 DIAGNOSIS — I10 Essential (primary) hypertension: Secondary | ICD-10-CM | POA: Diagnosis not present

## 2023-02-02 DIAGNOSIS — Z5181 Encounter for therapeutic drug level monitoring: Secondary | ICD-10-CM | POA: Diagnosis not present

## 2023-02-02 DIAGNOSIS — H409 Unspecified glaucoma: Secondary | ICD-10-CM | POA: Diagnosis not present

## 2023-02-02 DIAGNOSIS — Z0000 Encounter for general adult medical examination without abnormal findings: Secondary | ICD-10-CM | POA: Diagnosis not present

## 2023-02-22 DIAGNOSIS — H401234 Low-tension glaucoma, bilateral, indeterminate stage: Secondary | ICD-10-CM | POA: Diagnosis not present

## 2023-02-22 DIAGNOSIS — H04123 Dry eye syndrome of bilateral lacrimal glands: Secondary | ICD-10-CM | POA: Diagnosis not present

## 2023-02-22 DIAGNOSIS — H2513 Age-related nuclear cataract, bilateral: Secondary | ICD-10-CM | POA: Diagnosis not present

## 2023-04-20 ENCOUNTER — Other Ambulatory Visit: Payer: Self-pay | Admitting: Internal Medicine

## 2023-04-20 DIAGNOSIS — Z1231 Encounter for screening mammogram for malignant neoplasm of breast: Secondary | ICD-10-CM

## 2023-05-23 DIAGNOSIS — H401234 Low-tension glaucoma, bilateral, indeterminate stage: Secondary | ICD-10-CM | POA: Diagnosis not present

## 2023-05-30 ENCOUNTER — Ambulatory Visit: Payer: Medicare PPO | Admitting: Podiatry

## 2023-05-30 ENCOUNTER — Encounter: Payer: Self-pay | Admitting: Podiatry

## 2023-05-30 DIAGNOSIS — M79675 Pain in left toe(s): Secondary | ICD-10-CM

## 2023-05-30 DIAGNOSIS — M79674 Pain in right toe(s): Secondary | ICD-10-CM | POA: Diagnosis not present

## 2023-05-30 DIAGNOSIS — B351 Tinea unguium: Secondary | ICD-10-CM

## 2023-05-30 NOTE — Progress Notes (Signed)
  Subjective:  Patient ID: Glenda Roth, female    DOB: 13-Feb-1941,  MRN: [id number]  Glenda Roth presents to clinic today for: painful, elongated thickened toenails x 10 which are symptomatic when wearing enclosed shoe gear. This interferes with his/her daily activities.  Chief Complaint  Patient presents with   Nail Problem    Pt is here for RFC PCP is Dr Joice Nares and LOV was a few months ago.    PCP is Merl Star, MD.  Allergies  Allergen Reactions   Other Palpitations    Uncoded Allergy. Allergen: ANTIHISTAMINES AND DECONGESTANTS Uncoded Allergy. Allergen: ANTIHISTAMINES AND DECONGESTANTS   Ace Inhibitors Cough    Other reaction(s): Cough, coughing   Amlodipine Besylate     Other reaction(s): leg swelling Other reaction(s): leg swelling   Lisinopril     Other reaction(s): coughing   Rosuvastatin     Other Reaction(s): muscle cramps    Review of Systems: Negative except as noted in the HPI.  Objective: No changes noted in today's physical examination. There were no vitals filed for this visit.  Glenda Roth is a pleasant 83 y.o. female WD, WN in NAD. [name] x 3.  Vascular Examination: Capillary refill time <3 seconds b/l LE. Palpable pedal pulses b/l LE. Digital hair present b/l. No pedal edema b/l. Skin temperature gradient WNL b/l. No varicosities b/l. No ischemia or gangrene noted b/l LE. No cyanosis or clubbing noted b/l LE.[name]  Dermatological Examination: Pedal skin with normal turgor, texture and tone b/l. No open wounds. No interdigital macerations b/l. Toenails 1-5 b/l thickened, discolored, dystrophic with subungual debris. There is pain on palpation to dorsal aspect of nailplates. No corns, calluses nor porokeratotic lesions noted..  Neurological Examination: Protective sensation intact with 10 gram monofilament b/l LE. Vibratory sensation intact b/l LE.   Musculoskeletal Examination: Muscle strength 5/5 to all lower extremity muscle groups bilaterally.  No pain, crepitus or joint limitation noted with ROM bilateral LE. No gross bony deformities bilaterally.  Assessment/Plan: 1. Pain due to onychomycosis of toenails of both feet     Patient was evaluated and treated. All patient's and/or POA's questions/concerns addressed on today's visit. Toenails 1-5 debrided in length and girth without incident. Continue soft, supportive shoe gear daily. Report any pedal injuries to medical professional. Call office if there are any questions/concerns. -Patient/POA to call should there be question/concern in the interim.   Return in about 3 months (around 08/29/2023).  Luella Sager, DPM      [place] LOCATION: [address] [place] [address]                   Office [phone]   [facility] LOCATION: [address] [place], [place] [address] Office [phone]

## 2023-06-07 DIAGNOSIS — H401234 Low-tension glaucoma, bilateral, indeterminate stage: Secondary | ICD-10-CM | POA: Diagnosis not present

## 2023-06-07 DIAGNOSIS — H04123 Dry eye syndrome of bilateral lacrimal glands: Secondary | ICD-10-CM | POA: Diagnosis not present

## 2023-08-03 DIAGNOSIS — E7800 Pure hypercholesterolemia, unspecified: Secondary | ICD-10-CM | POA: Diagnosis not present

## 2023-08-03 DIAGNOSIS — E663 Overweight: Secondary | ICD-10-CM | POA: Diagnosis not present

## 2023-08-03 DIAGNOSIS — H409 Unspecified glaucoma: Secondary | ICD-10-CM | POA: Diagnosis not present

## 2023-08-03 DIAGNOSIS — M8588 Other specified disorders of bone density and structure, other site: Secondary | ICD-10-CM | POA: Diagnosis not present

## 2023-08-03 DIAGNOSIS — I10 Essential (primary) hypertension: Secondary | ICD-10-CM | POA: Diagnosis not present

## 2023-08-16 ENCOUNTER — Ambulatory Visit
Admission: RE | Admit: 2023-08-16 | Discharge: 2023-08-16 | Disposition: A | Discharge status: Home / Self Care | Payer: Medicare PPO | Source: Ambulatory Visit | Attending: Internal Medicine | Admitting: Internal Medicine

## 2023-08-16 DIAGNOSIS — Z1231 Encounter for screening mammogram for malignant neoplasm of breast: Secondary | ICD-10-CM

## 2023-09-11 ENCOUNTER — Ambulatory Visit: Admitting: Podiatry

## 2023-09-11 ENCOUNTER — Encounter: Payer: Self-pay | Admitting: Podiatry

## 2023-09-11 DIAGNOSIS — M79675 Pain in left toe(s): Secondary | ICD-10-CM

## 2023-09-11 DIAGNOSIS — B351 Tinea unguium: Secondary | ICD-10-CM

## 2023-09-11 DIAGNOSIS — M79674 Pain in right toe(s): Secondary | ICD-10-CM | POA: Diagnosis not present

## 2023-09-11 NOTE — Progress Notes (Signed)
  Subjective:  Patient ID: Glenda Roth, female    DOB: [birth year],  MRN: [id number]  83 y.o. female presents painful thick toenails that are difficult to trim. Pain interferes with ambulation. Aggravating factors include wearing enclosed shoe gear. Pain is relieved with periodic professional debridement.  New problem(s): None   PCP is Rexanne Ingle, MD , and last visit was August 03, 2023.  Allergies  Allergen Reactions   Other Palpitations    Uncoded Allergy. Allergen: ANTIHISTAMINES AND DECONGESTANTS Uncoded Allergy. Allergen: ANTIHISTAMINES AND DECONGESTANTS   Ace Inhibitors Cough    Other reaction(s): Cough, coughing   Amlodipine Besylate     Other reaction(s): leg swelling Other reaction(s): leg swelling   Lisinopril     Other reaction(s): coughing   Rosuvastatin     Other Reaction(s): muscle cramps    Review of Systems: Negative except as noted in the HPI.   Objective:  Glenda Roth is a pleasant 83 y.o. female WD, WN in NAD. [name] x 3.  Vascular Examination: Vascular status intact b/l with palpable pedal pulses. CFT immediate b/l. Pedal hair present. No edema. No pain with calf compression b/l. Skin temperature gradient WNL b/l. No varicosities noted. No cyanosis or clubbing noted.  Neurological Examination: Sensation grossly intact b/l with 10 gram monofilament. Vibratory sensation intact b/l.  Dermatological Examination: Pedal skin with normal turgor, texture and tone b/l. No open wounds nor interdigital macerations noted. Toenails 1-5 b/l thick, discolored, elongated with subungual debris and pain on dorsal palpation. No hyperkeratotic lesions noted b/l.   Musculoskeletal Examination: Muscle strength 5/5 to b/l LE.  No pain, crepitus noted b/l. No gross pedal deformities. Patient ambulates independently without assistive aids.   Radiographs: None Assessment:   1. Pain due to onychomycosis of toenails of both feet    Plan:  Consent given for treatment.  Patient examined. All patient's and/or POA's questions/concerns addressed on today's visit. Mycotic toenails 1-5 debrided in length and girth without incident. Continue soft, supportive shoe gear daily. Report any pedal injuries to medical professional. Call office if there are any quesitons/concerns. -Patient/POA to call should there be question/concern in the interim.  Return in about 3 months (around 12/12/2023).  Glenda Roth, DPM      [place] LOCATION: [address] [place] [address]                   Office [phone]   [facility] LOCATION: [address] [place], [place] [address] Office [phone]

## 2023-12-06 DIAGNOSIS — H2513 Age-related nuclear cataract, bilateral: Secondary | ICD-10-CM | POA: Diagnosis not present

## 2023-12-06 DIAGNOSIS — H04123 Dry eye syndrome of bilateral lacrimal glands: Secondary | ICD-10-CM | POA: Diagnosis not present

## 2023-12-06 DIAGNOSIS — H401234 Low-tension glaucoma, bilateral, indeterminate stage: Secondary | ICD-10-CM | POA: Diagnosis not present

## 2023-12-25 ENCOUNTER — Ambulatory Visit: Admitting: Podiatry

## 2023-12-25 ENCOUNTER — Encounter: Payer: Self-pay | Admitting: Podiatry

## 2023-12-25 DIAGNOSIS — M79675 Pain in left toe(s): Secondary | ICD-10-CM

## 2023-12-25 DIAGNOSIS — B351 Tinea unguium: Secondary | ICD-10-CM

## 2023-12-25 DIAGNOSIS — M79674 Pain in right toe(s): Secondary | ICD-10-CM

## 2023-12-30 NOTE — Progress Notes (Signed)
  Subjective:  Patient ID: Glenda Roth, female    DOB: 05-31-1940,  MRN: [id number]  [name] presents to clinic today for painful mycotic toenails x 10 which interfere with daily activities. Pain is relieved with periodic professional debridement.  Chief Complaint  Patient presents with   RFC     RFC not Diabetic PCP Dr. Tanda Bame, MD, 08/03/23    New problem(s): None.   PCP is Bame Tanda, MD.  Allergies  Allergen Reactions   Other Palpitations    Uncoded Allergy. Allergen: ANTIHISTAMINES AND DECONGESTANTS Uncoded Allergy. Allergen: ANTIHISTAMINES AND DECONGESTANTS   Ace Inhibitors Cough    Other reaction(s): Cough, coughing   Amlodipine Besylate     Other reaction(s): leg swelling Other reaction(s): leg swelling   Lisinopril     Other reaction(s): coughing   Rosuvastatin     Other Reaction(s): muscle cramps    Review of Systems: Negative except as noted in the HPI.  Objective: No changes noted in today's physical examination. There were no vitals filed for this visit. AHMAYA Roth is a pleasant 83 y.o. female WD, WN in NAD. [name] x 3.  Vascular Examination: Capillary refill time immediate b/l. Vascular status intact b/l with palpable pedal pulses. Pedal hair present b/l. No pain with calf compression b/l. Skin temperature gradient WNL b/l. No cyanosis or clubbing b/l. No ischemia or gangrene noted b/l.   Neurological Examination: Sensation grossly intact b/l with 10 gram monofilament.   Dermatological Examination: Pedal skin with normal turgor, texture and tone b/l.  No open wounds. No interdigital macerations.   Toenails 1-5 b/l thick, discolored, elongated with subungual debris and pain on dorsal palpation.   No corns, calluses, nor porokeratotic lesions.  Musculoskeletal Examination: Muscle strength 5/5 to all lower extremity muscle groups bilaterally. No pain, crepitus or joint limitation noted with ROM bilateral LE. Patient ambulates  independent of any assistive aids.  Radiographs: None  Assessment/Plan: 1. Pain due to onychomycosis of toenails of both feet   Patient was evaluated and treated. All patient's and/or POA's questions/concerns addressed on today's visit. Toenails 1-5 b/l debrided in length and girth without incident. Continue soft, supportive shoe gear daily. Report any pedal injuries to medical professional. Call office if there are any questions/concerns. -Patient/POA to call should there be question/concern in the interim.   Return in about 3 months (around 03/26/2024).  Delon LITTIE Merlin, DPM      [place] LOCATION: [address] [place] [address]                   Office [phone]   [facility] LOCATION: [address] [place], [place] [address] Office [phone]

## 2024-04-08 ENCOUNTER — Ambulatory Visit: Admitting: Podiatry
